# Patient Record
Sex: Male | Born: 1935 | Race: White | Hispanic: No | Smoking: Never smoker
Health system: Southern US, Community
[De-identification: ages and names within clinical notes are randomized; demographics above are authoritative.]

## PROBLEM LIST (undated history)

## (undated) DIAGNOSIS — Q211 Atrial septal defect: Secondary | ICD-10-CM

## (undated) DIAGNOSIS — R9430 Abnormal result of cardiovascular function study, unspecified: Secondary | ICD-10-CM

## (undated) DIAGNOSIS — I10 Essential (primary) hypertension: Secondary | ICD-10-CM

## (undated) DIAGNOSIS — Q2112 Patent foramen ovale: Secondary | ICD-10-CM

## (undated) DIAGNOSIS — I480 Paroxysmal atrial fibrillation: Secondary | ICD-10-CM

## (undated) DIAGNOSIS — I2510 Atherosclerotic heart disease of native coronary artery without angina pectoris: Secondary | ICD-10-CM

## (undated) DIAGNOSIS — Z951 Presence of aortocoronary bypass graft: Secondary | ICD-10-CM

## (undated) DIAGNOSIS — Z950 Presence of cardiac pacemaker: Secondary | ICD-10-CM

## (undated) DIAGNOSIS — I2699 Other pulmonary embolism without acute cor pulmonale: Secondary | ICD-10-CM

## (undated) DIAGNOSIS — I441 Atrioventricular block, second degree: Secondary | ICD-10-CM

## (undated) DIAGNOSIS — I4892 Unspecified atrial flutter: Secondary | ICD-10-CM

## (undated) DIAGNOSIS — I340 Nonrheumatic mitral (valve) insufficiency: Secondary | ICD-10-CM

## (undated) DIAGNOSIS — C61 Malignant neoplasm of prostate: Secondary | ICD-10-CM

## (undated) DIAGNOSIS — N400 Enlarged prostate without lower urinary tract symptoms: Secondary | ICD-10-CM

## (undated) DIAGNOSIS — E785 Hyperlipidemia, unspecified: Secondary | ICD-10-CM

## (undated) HISTORY — DX: Essential (primary) hypertension: I10

## (undated) HISTORY — PX: TOTAL KNEE ARTHROPLASTY: SHX125

## (undated) HISTORY — DX: Atrial septal defect: Q21.1

## (undated) HISTORY — PX: BACK SURGERY: SHX140

## (undated) HISTORY — PX: TOTAL HIP ARTHROPLASTY: SHX124

## (undated) HISTORY — DX: Patent foramen ovale: Q21.12

## (undated) HISTORY — DX: Atrioventricular block, second degree: I44.1

## (undated) HISTORY — PX: INGUINAL HERNIA REPAIR: SUR1180

## (undated) HISTORY — DX: Paroxysmal atrial fibrillation: I48.0

## (undated) HISTORY — DX: Presence of aortocoronary bypass graft: Z95.1

## (undated) HISTORY — DX: Abnormal result of cardiovascular function study, unspecified: R94.30

## (undated) HISTORY — DX: Atherosclerotic heart disease of native coronary artery without angina pectoris: I25.10

## (undated) HISTORY — DX: Nonrheumatic mitral (valve) insufficiency: I34.0

## (undated) HISTORY — PX: OTHER SURGICAL HISTORY: SHX169

## (undated) HISTORY — DX: Hyperlipidemia, unspecified: E78.5

## (undated) HISTORY — DX: Unspecified atrial flutter: I48.92

## (undated) HISTORY — DX: Presence of cardiac pacemaker: Z95.0

---

## 2001-02-28 ENCOUNTER — Ambulatory Visit (HOSPITAL_BASED_OUTPATIENT_CLINIC_OR_DEPARTMENT_OTHER): Admission: RE | Admit: 2001-02-28 | Discharge: 2001-03-01 | Payer: Self-pay | Admitting: General Surgery

## 2001-02-28 ENCOUNTER — Encounter: Admission: RE | Admit: 2001-02-28 | Discharge: 2001-02-28 | Payer: Self-pay | Admitting: General Surgery

## 2001-02-28 ENCOUNTER — Encounter: Payer: Self-pay | Admitting: General Surgery

## 2007-05-31 ENCOUNTER — Encounter: Payer: Self-pay | Admitting: Cardiology

## 2008-04-26 HISTORY — PX: ATRIAL ABLATION SURGERY: SHX560

## 2008-06-09 ENCOUNTER — Inpatient Hospital Stay (HOSPITAL_COMMUNITY): Admission: EM | Admit: 2008-06-09 | Discharge: 2008-06-23 | Payer: Self-pay | Admitting: Cardiology

## 2008-06-09 ENCOUNTER — Ambulatory Visit: Payer: Self-pay | Admitting: Internal Medicine

## 2008-06-10 ENCOUNTER — Encounter: Payer: Self-pay | Admitting: Cardiology

## 2008-06-10 ENCOUNTER — Encounter: Payer: Self-pay | Admitting: Thoracic Surgery (Cardiothoracic Vascular Surgery)

## 2008-06-10 ENCOUNTER — Ambulatory Visit: Payer: Self-pay | Admitting: Thoracic Surgery (Cardiothoracic Vascular Surgery)

## 2008-06-11 ENCOUNTER — Encounter (INDEPENDENT_AMBULATORY_CARE_PROVIDER_SITE_OTHER): Payer: Self-pay | Admitting: Internal Medicine

## 2008-06-11 ENCOUNTER — Ambulatory Visit: Payer: Self-pay | Admitting: Cardiology

## 2008-06-17 HISTORY — PX: CORONARY ARTERY BYPASS GRAFT: SHX141

## 2008-06-21 HISTORY — PX: CARDIAC PACEMAKER PLACEMENT: SHX583

## 2008-06-22 ENCOUNTER — Encounter: Payer: Self-pay | Admitting: Cardiology

## 2008-06-27 ENCOUNTER — Encounter: Payer: Self-pay | Admitting: Internal Medicine

## 2008-07-03 ENCOUNTER — Ambulatory Visit: Payer: Self-pay

## 2008-07-09 ENCOUNTER — Encounter: Admission: RE | Admit: 2008-07-09 | Discharge: 2008-07-09 | Payer: Self-pay | Admitting: Surgery

## 2008-07-09 ENCOUNTER — Ambulatory Visit: Payer: Self-pay | Admitting: Cardiology

## 2008-07-09 ENCOUNTER — Ambulatory Visit: Payer: Self-pay | Admitting: Surgery

## 2008-08-28 ENCOUNTER — Ambulatory Visit: Payer: Self-pay | Admitting: Cardiology

## 2008-08-28 ENCOUNTER — Encounter: Payer: Self-pay | Admitting: Physician Assistant

## 2008-09-10 ENCOUNTER — Encounter: Payer: Self-pay | Admitting: Cardiology

## 2008-10-18 ENCOUNTER — Encounter: Payer: Self-pay | Admitting: Physician Assistant

## 2008-10-18 ENCOUNTER — Ambulatory Visit: Payer: Self-pay | Admitting: Internal Medicine

## 2008-10-22 ENCOUNTER — Ambulatory Visit: Payer: Self-pay

## 2008-10-29 ENCOUNTER — Ambulatory Visit: Payer: Self-pay | Admitting: Cardiology

## 2008-11-12 ENCOUNTER — Encounter: Payer: Self-pay | Admitting: Internal Medicine

## 2008-11-12 ENCOUNTER — Ambulatory Visit: Payer: Self-pay

## 2008-11-18 ENCOUNTER — Encounter: Payer: Self-pay | Admitting: Cardiology

## 2008-11-19 ENCOUNTER — Ambulatory Visit: Payer: Self-pay | Admitting: Cardiology

## 2008-11-26 ENCOUNTER — Ambulatory Visit: Payer: Self-pay | Admitting: Cardiology

## 2008-12-03 ENCOUNTER — Ambulatory Visit: Payer: Self-pay | Admitting: Cardiology

## 2008-12-04 ENCOUNTER — Encounter: Payer: Self-pay | Admitting: Cardiology

## 2008-12-04 DIAGNOSIS — I2510 Atherosclerotic heart disease of native coronary artery without angina pectoris: Secondary | ICD-10-CM | POA: Insufficient documentation

## 2008-12-05 ENCOUNTER — Ambulatory Visit: Payer: Self-pay | Admitting: Cardiology

## 2008-12-05 ENCOUNTER — Encounter: Payer: Self-pay | Admitting: Physician Assistant

## 2008-12-06 ENCOUNTER — Ambulatory Visit: Payer: Self-pay | Admitting: Cardiology

## 2008-12-09 ENCOUNTER — Encounter: Payer: Self-pay | Admitting: *Deleted

## 2008-12-12 ENCOUNTER — Ambulatory Visit: Payer: Self-pay | Admitting: Cardiology

## 2008-12-12 ENCOUNTER — Encounter: Payer: Self-pay | Admitting: Physician Assistant

## 2008-12-13 ENCOUNTER — Encounter: Payer: Self-pay | Admitting: Cardiology

## 2008-12-20 ENCOUNTER — Ambulatory Visit: Payer: Self-pay | Admitting: Cardiology

## 2008-12-20 LAB — CONVERTED CEMR LAB: Prothrombin Time: 18.7 s

## 2008-12-25 ENCOUNTER — Ambulatory Visit: Payer: Self-pay | Admitting: Cardiology

## 2009-01-03 ENCOUNTER — Ambulatory Visit: Payer: Self-pay | Admitting: Cardiology

## 2009-01-10 ENCOUNTER — Ambulatory Visit: Payer: Self-pay | Admitting: Cardiology

## 2009-01-10 LAB — CONVERTED CEMR LAB: POC INR: 2.3

## 2009-01-13 ENCOUNTER — Telehealth (INDEPENDENT_AMBULATORY_CARE_PROVIDER_SITE_OTHER): Payer: Self-pay | Admitting: *Deleted

## 2009-01-13 ENCOUNTER — Encounter: Payer: Self-pay | Admitting: Internal Medicine

## 2009-01-17 ENCOUNTER — Ambulatory Visit: Payer: Self-pay | Admitting: Cardiology

## 2009-01-17 LAB — CONVERTED CEMR LAB: POC INR: 4.9

## 2009-01-24 ENCOUNTER — Ambulatory Visit: Payer: Self-pay | Admitting: Cardiology

## 2009-01-24 LAB — CONVERTED CEMR LAB: POC INR: 2.7

## 2009-01-28 ENCOUNTER — Telehealth (INDEPENDENT_AMBULATORY_CARE_PROVIDER_SITE_OTHER): Payer: Self-pay | Admitting: *Deleted

## 2009-01-28 ENCOUNTER — Ambulatory Visit: Payer: Self-pay | Admitting: Cardiology

## 2009-01-28 LAB — CONVERTED CEMR LAB: POC INR: 4.3

## 2009-02-03 ENCOUNTER — Encounter: Payer: Self-pay | Admitting: Internal Medicine

## 2009-02-03 ENCOUNTER — Ambulatory Visit: Payer: Self-pay | Admitting: Cardiology

## 2009-02-03 LAB — CONVERTED CEMR LAB: POC INR: 1.9

## 2009-02-04 ENCOUNTER — Ambulatory Visit: Payer: Self-pay | Admitting: Cardiology

## 2009-02-04 ENCOUNTER — Encounter: Payer: Self-pay | Admitting: Cardiology

## 2009-02-06 ENCOUNTER — Ambulatory Visit: Payer: Self-pay | Admitting: Internal Medicine

## 2009-02-06 ENCOUNTER — Inpatient Hospital Stay (HOSPITAL_COMMUNITY): Admission: RE | Admit: 2009-02-06 | Discharge: 2009-02-07 | Payer: Self-pay | Admitting: Internal Medicine

## 2009-02-06 ENCOUNTER — Encounter: Payer: Self-pay | Admitting: Internal Medicine

## 2009-02-11 ENCOUNTER — Ambulatory Visit: Payer: Self-pay | Admitting: Cardiology

## 2009-02-18 ENCOUNTER — Ambulatory Visit: Payer: Self-pay | Admitting: Cardiology

## 2009-02-18 LAB — CONVERTED CEMR LAB: POC INR: 3

## 2009-02-28 ENCOUNTER — Ambulatory Visit: Payer: Self-pay | Admitting: Cardiology

## 2009-03-07 ENCOUNTER — Ambulatory Visit: Payer: Self-pay | Admitting: Cardiology

## 2009-03-07 ENCOUNTER — Ambulatory Visit: Payer: Self-pay | Admitting: Internal Medicine

## 2009-03-28 ENCOUNTER — Ambulatory Visit: Payer: Self-pay | Admitting: Cardiology

## 2009-04-17 ENCOUNTER — Encounter: Payer: Self-pay | Admitting: Cardiology

## 2009-04-17 ENCOUNTER — Telehealth (INDEPENDENT_AMBULATORY_CARE_PROVIDER_SITE_OTHER): Payer: Self-pay | Admitting: *Deleted

## 2009-04-19 ENCOUNTER — Encounter: Payer: Self-pay | Admitting: Cardiology

## 2009-05-02 ENCOUNTER — Ambulatory Visit: Payer: Self-pay | Admitting: Cardiology

## 2009-05-02 ENCOUNTER — Telehealth: Payer: Self-pay | Admitting: Internal Medicine

## 2009-05-02 LAB — CONVERTED CEMR LAB: POC INR: 1

## 2009-05-16 ENCOUNTER — Ambulatory Visit: Payer: Self-pay | Admitting: Cardiology

## 2009-05-16 LAB — CONVERTED CEMR LAB: POC INR: 2

## 2009-06-13 ENCOUNTER — Ambulatory Visit: Payer: Self-pay | Admitting: Cardiology

## 2009-06-13 LAB — CONVERTED CEMR LAB: POC INR: 1.2

## 2009-06-27 ENCOUNTER — Ambulatory Visit: Payer: Self-pay | Admitting: Cardiology

## 2009-06-27 LAB — CONVERTED CEMR LAB: POC INR: 1.3

## 2009-07-11 ENCOUNTER — Ambulatory Visit: Payer: Self-pay | Admitting: Cardiology

## 2009-07-11 LAB — CONVERTED CEMR LAB: POC INR: 2.2

## 2009-07-14 ENCOUNTER — Ambulatory Visit (HOSPITAL_COMMUNITY): Admission: RE | Admit: 2009-07-14 | Discharge: 2009-07-14 | Payer: Self-pay | Admitting: Ophthalmology

## 2009-08-01 ENCOUNTER — Ambulatory Visit: Payer: Self-pay | Admitting: Cardiology

## 2009-09-05 ENCOUNTER — Ambulatory Visit: Payer: Self-pay | Admitting: Cardiology

## 2009-10-03 ENCOUNTER — Ambulatory Visit: Payer: Self-pay | Admitting: Cardiology

## 2009-10-03 ENCOUNTER — Ambulatory Visit: Payer: Self-pay | Admitting: Internal Medicine

## 2009-11-14 ENCOUNTER — Telehealth: Payer: Self-pay | Admitting: Internal Medicine

## 2009-11-14 ENCOUNTER — Ambulatory Visit: Payer: Self-pay | Admitting: Cardiology

## 2009-11-25 ENCOUNTER — Ambulatory Visit: Payer: Self-pay | Admitting: Cardiology

## 2009-12-09 ENCOUNTER — Ambulatory Visit: Payer: Self-pay | Admitting: Cardiology

## 2009-12-30 ENCOUNTER — Ambulatory Visit: Payer: Self-pay | Admitting: Cardiology

## 2009-12-30 LAB — CONVERTED CEMR LAB: POC INR: 2.5

## 2010-01-20 ENCOUNTER — Ambulatory Visit: Payer: Self-pay | Admitting: Cardiology

## 2010-01-20 LAB — CONVERTED CEMR LAB: POC INR: 2.1

## 2010-02-17 ENCOUNTER — Ambulatory Visit: Payer: Self-pay | Admitting: Cardiology

## 2010-03-03 ENCOUNTER — Ambulatory Visit: Payer: Self-pay | Admitting: Cardiology

## 2010-03-03 LAB — CONVERTED CEMR LAB: POC INR: 2.4

## 2010-03-31 ENCOUNTER — Ambulatory Visit: Payer: Self-pay | Admitting: Cardiology

## 2010-03-31 LAB — CONVERTED CEMR LAB: POC INR: 2.2

## 2010-04-28 ENCOUNTER — Ambulatory Visit: Admission: RE | Admit: 2010-04-28 | Discharge: 2010-04-28 | Payer: Self-pay | Source: Home / Self Care

## 2010-04-28 LAB — CONVERTED CEMR LAB: POC INR: 2.1

## 2010-05-04 ENCOUNTER — Ambulatory Visit
Admission: RE | Admit: 2010-05-04 | Discharge: 2010-05-04 | Payer: Self-pay | Source: Home / Self Care | Attending: Internal Medicine | Admitting: Internal Medicine

## 2010-05-04 ENCOUNTER — Encounter: Payer: Self-pay | Admitting: Internal Medicine

## 2010-05-04 DIAGNOSIS — I480 Paroxysmal atrial fibrillation: Secondary | ICD-10-CM | POA: Insufficient documentation

## 2010-05-26 ENCOUNTER — Ambulatory Visit: Admission: RE | Admit: 2010-05-26 | Discharge: 2010-05-26 | Payer: Self-pay | Source: Home / Self Care

## 2010-05-26 LAB — CONVERTED CEMR LAB: POC INR: 1.6

## 2010-05-26 NOTE — Medication Information (Signed)
Summary: ccr-lr  Anticoagulant Therapy  Managed by: Vashti Hey, RN PCP: Doreen Beam, MD Supervising MD: Diona Browner MD, Remi Deter Indication 1: Atrial Flutter (ICD-427.32) Lab [facility] [place] Site: [place] INR POC 1.0  Dietary changes: no    Health status changes: no    Bleeding/hemorrhagic complications: no    Recent/future hospitalizations: yes       Details: was in hospital at [facility] for severe dizzy spell   Any changes in medication regimen? no    Recent/future dental: no  Any missed doses?: yes     Details: Purposefully missed coumadin 2 days last week  Is patient compliant with meds? no     Details: Occassionally adjusts coumadin himself  Comments: Pt was admitted to [facility] 04/17/09 - 04/19/09 for severe dizzy spell at home.  Questionable vertigo vs CVA/TIA.  CT scan negative.  Unable to have MRI due to pacer.  Was treated with Meclizine for vertigo.  Pt is convinced blood was too thin and that is what caused dizziness.  INR on admission was 1.2.  Pt does not want to continue to take coumadin and purposefully held 2 doses last week.  INR today 1.0.   Stressed importance of taking coumadin as ordered to reduce risk of CVA.  Pt wants to come off coumadin but agrees to take until I discuss with Dr Johney Frame.  (Last office note states he might be able to stop after next pacer check in May)  Will forward this message to Dr Johney Frame.  Allergies: 1)  ! Crestor  Anticoagulation Management History:      The patient is taking warfarin and comes in today for a routine follow up visit.  Positive risk factors for bleeding include an age of 61 years or older.  The bleeding index is 'intermediate risk'.  Positive CHADS2 values include History of HTN and Age > 12 years old.  The start date was 10/18/2008.  Anticoagulation responsible provider: Diona Browner MD, Remi Deter.  INR POC: 1.0.  Cuvette Lot#: [id number].  Exp: 10/11.    Anticoagulation Management Assessment/Plan:      The patient's current  anticoagulation dose is Warfarin sodium 5 mg tabs: Use as directed by Anticoagulation Clinic.  The target INR is 2 - 3.  The next INR is due 05/16/2009.  Anticoagulation instructions were given to patient.  Results were reviewed/authorized by Vashti Hey, RN.  He was notified by Vashti Hey RN.         Prior Anticoagulation Instructions: INR 2.2 Continue coumadin 15mg  once daily except 10mg  on S,T,Th  Current Anticoagulation Instructions: INR 1.0 See previous note this visit Continue coumadin 15mg  once daily except 10mg  on S,T,Th

## 2010-05-26 NOTE — Medication Information (Signed)
Summary: ccr-lr  Anticoagulant Therapy  Managed by: Vashti Hey, RN PCP: Doreen Beam, MD Supervising MD: Antoine Poche MD, Fayrene Fearing Indication 1: Atrial Flutter (ICD-427.32) Lab [facility] [place] Site: [place] INR POC 2.2  Dietary changes: no    Health status changes: no    Bleeding/hemorrhagic complications: no    Recent/future hospitalizations: no    Any changes in medication regimen? no    Recent/future dental: no  Any missed doses?: yes     Details: Missed several doses of his coumadin due to bleeding from cracked lips  Is patient compliant with meds? no     Details: hold coumadin himself and take as he thinks he needs to   Allergies: 1)  ! Crestor  Anticoagulation Management History:      The patient is taking warfarin and comes in today for a routine follow up visit.  Positive risk factors for bleeding include an age of 28 years or older.  The bleeding index is 'intermediate risk'.  Positive CHADS2 values include History of HTN and Age > 75 years old.  The start date was 10/18/2008.  Anticoagulation responsible Ismeal Heider: Antoine Poche MD, Fayrene Fearing.  INR POC: 2.2.  Cuvette Lot#: [id number].  Exp: 10/11.    Anticoagulation Management Assessment/Plan:      The patient's current anticoagulation dose is Warfarin sodium 5 mg tabs: Use as directed by Anticoagulation Clinic.  The target INR is 2 - 3.  The next INR is due 04/28/2010.  Anticoagulation instructions were given to patient.  Results were reviewed/authorized by Vashti Hey, RN.  He was notified by Vashti Hey RN.         Prior Anticoagulation Instructions: INR 2.4 Continue coumadin 15mg  once daily    Current Anticoagulation Instructions: INR 2.2 Continue coumadin 15mg  once daily

## 2010-05-26 NOTE — Medication Information (Signed)
Summary: cccr-lr  Anticoagulant Therapy  Managed by: Vashti Hey, RN PCP: Doreen Beam, MD Supervising MD: Andee Lineman MD, Michelle Piper Indication 1: Atrial Flutter (ICD-427.32) Lab [facility]: [facility] [place] Site: [place] INR POC 1.3  Dietary changes: no    Health status changes: no    Bleeding/hemorrhagic complications: no    Recent/future hospitalizations: no    Any changes in medication regimen? no    Recent/future dental: no  Any missed doses?: yes     Details: missed 2 days  Rx ran out  Is patient compliant with meds? yes       Allergies: 1)  ! Crestor  Anticoagulation Management History:      The patient is taking warfarin and comes in today for a routine follow up visit.  Positive risk factors for bleeding include an age of 30 years or older.  The bleeding index is 'intermediate risk'.  Positive CHADS2 values include History of HTN and Age > 65 years old.  The start date was 10/18/2008.  Anticoagulation responsible provider: Andee Lineman MD, Michelle Piper.  INR POC: 1.3.  Cuvette Lot#: [id number].  Exp: 10/11.    Anticoagulation Management Assessment/Plan:      The patient's current anticoagulation dose is Warfarin sodium 5 mg tabs: Use as directed by Anticoagulation Clinic.  The target INR is 2 - 3.  The next INR is due 07/11/2009.  Anticoagulation instructions were given to patient.  Results were reviewed/authorized by Vashti Hey, RN.  He was notified by Vashti Hey RN.         Prior Anticoagulation Instructions: INR 1.2 Take coumadin 20mg  x 3 days then resume 15mg  once daily except 10mg  on S,T,Th.  Current Anticoagulation Instructions: INR 1.3 Take 20mg  x 3 days then resume 15mg  once daily except 10mg  on S,T,Th Pt still wants to come off coumadin  If INR not better next INR check, will discontinue coumadin per Dr Jenel Lucks previous note.

## 2010-05-26 NOTE — Medication Information (Signed)
Summary: ccr-lr  Anticoagulant Therapy  Managed by: Vashti Hey, RN PCP: Doreen Beam, MD Supervising MD: Diona Browner MD, Remi Deter Indication 1: Atrial Flutter (ICD-427.32) Lab [facility] [place] Site: [place] INR POC 1.1  Dietary changes: no    Health status changes: no    Bleeding/hemorrhagic complications: no    Recent/future hospitalizations: no    Any changes in medication regimen? no    Recent/future dental: no  Any missed doses?: yes     Details: Has been taking 1 1/2 tablets instead of 3  Is patient compliant with meds? no     Details: not taking coumadin correctly.  Thinks it makes him swimmy-headed   Allergies: 1)  ! Crestor  Anticoagulation Management History:      The patient is taking warfarin and comes in today for a routine follow up visit.  Positive risk factors for bleeding include an age of 75 years or older.  The bleeding index is 'intermediate risk'.  Positive CHADS2 values include History of HTN and Age > 68 years old.  The start date was 10/18/2008.  Anticoagulation responsible Izola Teague: Diona Browner MD, Remi Deter.  INR POC: 1.1.  Cuvette Lot#: [id number].  Exp: 10/11.    Anticoagulation Management Assessment/Plan:      The patient's current anticoagulation dose is Warfarin sodium 5 mg tabs: Use as directed by Anticoagulation Clinic.  The target INR is 2 - 3.  The next INR is due 03/03/2010.  Anticoagulation instructions were given to patient.  Results were reviewed/authorized by Vashti Hey, RN.  He was notified by Vashti Hey RN.         Prior Anticoagulation Instructions: INR 2.1 Continue coumadin 3 tablets once daily   Current Anticoagulation Instructions: INR 1.1 Has not been taking coumadin correctly.  Thinks it makes him swimmy headed. Risk of CVA stressed to pt. Restart coumadin 15mg  once daily

## 2010-05-26 NOTE — Medication Information (Signed)
Summary: ccr-lr  Anticoagulant Therapy  Managed by: Vashti Hey, RN PCP: Doreen Beam, MD Supervising MD: Diona Browner MD, Remi Deter Indication 1: Atrial Flutter (ICD-427.32) Lab [facility] [place] Site: [place] INR POC 2.3  Dietary changes: no    Health status changes: no    Bleeding/hemorrhagic complications: no    Recent/future hospitalizations: yes       Details: had cataract surgery last week  Any changes in medication regimen? no    Recent/future dental: no  Any missed doses?: no       Is patient compliant with meds? yes       Allergies: 1)  ! Crestor  Anticoagulation Management History:      The patient is taking warfarin and comes in today for a routine follow up visit.  Positive risk factors for bleeding include an age of 46 years or older.  The bleeding index is 'intermediate risk'.  Positive CHADS2 values include History of HTN and Age > 66 years old.  The start date was 10/18/2008.  Anticoagulation responsible provider: Diona Browner MD, Remi Deter.  INR POC: 2.3.  Cuvette Lot#: [id number].  Exp: 10/11.    Anticoagulation Management Assessment/Plan:      The patient's current anticoagulation dose is Warfarin sodium 5 mg tabs: Use as directed by Anticoagulation Clinic.  The target INR is 2 - 3.  The next INR is due 08/29/2009.  Anticoagulation instructions were given to patient.  Results were reviewed/authorized by Vashti Hey, RN.  He was notified by Vashti Hey RN.         Prior Anticoagulation Instructions: INR 2.2 Continue coumadin 15mg  once daily except 10mg  on S,T,Th Pt states he has been taking coumadin regularly as ordered this month.  Is bad for non-compliance in taking coumadin.  Current Anticoagulation Instructions: INR 2.3 Continue coumadin 15mg  once daily except 10mg  on S,T,Th

## 2010-05-26 NOTE — Medication Information (Signed)
Summary: ccr-lr  Anticoagulant Therapy  Managed by: Vashti Hey, RN PCP: Doreen Beam, MD Supervising MD: Myrtis Ser MD, Tinnie Gens Indication 1: Atrial Flutter (ICD-427.32) Lab [facility] [place] Site: [place] INR POC 1.8  Dietary changes: no    Health status changes: no    Bleeding/hemorrhagic complications: no    Recent/future hospitalizations: no    Any changes in medication regimen? no    Recent/future dental: no  Any missed doses?: no       Is patient compliant with meds? yes       Allergies: 1)  ! Crestor  Anticoagulation Management History:      The patient is taking warfarin and comes in today for a routine follow up visit.  Positive risk factors for bleeding include an age of 75 years or older.  The bleeding index is 'intermediate risk'.  Positive CHADS2 values include History of HTN and Age > 41 years old.  The start date was 10/18/2008.  Anticoagulation responsible provider: Myrtis Ser MD, Tinnie Gens.  INR POC: 1.8.  Cuvette Lot#: [id number].  Exp: 10/11.    Anticoagulation Management Assessment/Plan:      The patient's current anticoagulation dose is Warfarin sodium 5 mg tabs: Use as directed by Anticoagulation Clinic.  The target INR is 2 - 3.  The next INR is due 10/03/2009.  Anticoagulation instructions were given to patient.  Results were reviewed/authorized by Vashti Hey, RN.  He was notified by Vashti Hey RN.         Prior Anticoagulation Instructions: INR 2.3 Continue coumadin 15mg  once daily except 10mg  on S,T,Th  Current Anticoagulation Instructions: INR 1.8 Take coumadin 20mg  tonight and tomorrow night then resume 15mg  once daily except 10mg  on S,T,Th

## 2010-05-26 NOTE — Medication Information (Signed)
Summary: ccr-lr  Anticoagulant Therapy  Managed by: Vashti Hey, RN PCP: Doreen Beam, MD Supervising MD: Diona Browner MD, Remi Deter Indication 1: Atrial Flutter (ICD-427.32) Lab [facility] [place] Site: [place] INR POC 2.4  Dietary changes: no    Health status changes: no    Bleeding/hemorrhagic complications: no    Recent/future hospitalizations: no    Any changes in medication regimen? no    Recent/future dental: no  Any missed doses?: no       Is patient compliant with meds? yes       Allergies: 1)  ! Crestor  Anticoagulation Management History:      The patient is taking warfarin and comes in today for a routine follow up visit.  Positive risk factors for bleeding include an age of 75 years or older.  The bleeding index is 'intermediate risk'.  Positive CHADS2 values include History of HTN and Age > 53 years old.  The start date was 10/18/2008.  Anticoagulation responsible Salem Mastrogiovanni: Diona Browner MD, Remi Deter.  INR POC: 2.4.  Cuvette Lot#: [id number].  Exp: 10/11.    Anticoagulation Management Assessment/Plan:      The patient's current anticoagulation dose is Warfarin sodium 5 mg tabs: Use as directed by Anticoagulation Clinic.  The target INR is 2 - 3.  The next INR is due 03/31/2010.  Anticoagulation instructions were given to patient.  Results were reviewed/authorized by Vashti Hey, RN.  He was notified by Vashti Hey RN.         Prior Anticoagulation Instructions: INR 1.1 Has not been taking coumadin correctly.  Thinks it makes him swimmy headed. Risk of CVA stressed to pt. Restart coumadin 15mg  once daily   Current Anticoagulation Instructions: INR 2.4 Continue coumadin 15mg  once daily

## 2010-05-26 NOTE — Medication Information (Signed)
Summary: ccr-lr  Anticoagulant Therapy  Managed by: Vashti Hey, RN PCP: Doreen Beam, MD Supervising MD: Antoine Poche MD, Fayrene Fearing Indication 1: Atrial Flutter (ICD-427.32) Lab [facility]: [facility] [place] Site: [place] INR POC 2.5  Dietary changes: no    Health status changes: no    Bleeding/hemorrhagic complications: no    Recent/future hospitalizations: no    Any changes in medication regimen? no    Recent/future dental: no  Any missed doses?: no       Is patient compliant with meds? yes       Allergies: 1)  ! Crestor  Anticoagulation Management History:      The patient is taking warfarin and comes in today for a routine follow up visit.  Positive risk factors for bleeding include an age of 75 years or older.  The bleeding index is 'intermediate risk'.  Positive CHADS2 values include History of HTN and Age > 25 years old.  The start date was 10/18/2008.  Anticoagulation responsible provider: Antoine Poche MD, Fayrene Fearing.  INR POC: 2.5.  Cuvette Lot#: [id number].  Exp: 10/11.    Anticoagulation Management Assessment/Plan:      The patient's current anticoagulation dose is Warfarin sodium 5 mg tabs: Use as directed by Anticoagulation Clinic.  The target INR is 2 - 3.  The next INR is due 01/20/2010.  Anticoagulation instructions were given to patient.  Results were reviewed/authorized by Vashti Hey, RN.  He was notified by Vashti Hey RN.         Prior Anticoagulation Instructions: INR 1.7 Increase coumadin to 15mg  once daily   Current Anticoagulation Instructions: INR 2.5 Continue coumadin 15mg  once daily

## 2010-05-26 NOTE — Medication Information (Signed)
Summary: ccr-lr  Anticoagulant Therapy  Managed by: Vashti Hey, RN PCP: Doreen Beam, MD Supervising MD: Diona Browner MD, Remi Deter Indication 1: Atrial Flutter (ICD-427.32) Lab [facility] [place] Site: [place] INR POC 2.3  Dietary changes: no    Health status changes: no    Bleeding/hemorrhagic complications: no    Recent/future hospitalizations: no    Any changes in medication regimen? no    Recent/future dental: no  Any missed doses?: yes     Details: Missed 3 days   Left med in [place] extra when he got it .  Advised against this.  Is patient compliant with meds? yes       Allergies: 1)  ! Crestor  Anticoagulation Management History:      The patient is taking warfarin and comes in today for a routine follow up visit.  Positive risk factors for bleeding include an age of 5 years or older.  The bleeding index is 'intermediate risk'.  Positive CHADS2 values include History of HTN and Age > 82 years old.  The start date was 10/18/2008.  Anticoagulation responsible provider: Diona Browner MD, Remi Deter.  INR POC: 2.3.  Cuvette Lot#: [id number].  Exp: 10/11.    Anticoagulation Management Assessment/Plan:      The patient's current anticoagulation dose is Warfarin sodium 5 mg tabs: Use as directed by Anticoagulation Clinic.  The target INR is 2 - 3.  The next INR is due 10/31/2009.  Anticoagulation instructions were given to patient.  Results were reviewed/authorized by Vashti Hey, RN.  He was notified by Vashti Hey RN.         Prior Anticoagulation Instructions: INR 1.8 Take coumadin 20mg  tonight and tomorrow night then resume 15mg  once daily except 10mg  on S,T,Th  Current Anticoagulation Instructions: INR 2.3 Continue coumadin 15mg  once daily except 10mg  on S,T,Th

## 2010-05-26 NOTE — Progress Notes (Signed)
Summary: pt wants to stop coumadin  Phone Note Outgoing Call   Call placed by: Vashti Hey RN CV[facility] [place] Call placed to: Dr Johney Frame Summary of Call: Pt wants to stop coumadin against my recommendation.  Please review below and advise.   Pt was admitted to [facility] 04/17/09 - 04/19/09 for severe dizzy spell at home.  Questionable vertigo vs CVA/TIA.  CT scan negative.  Unable to have MRI due to pacer.  Was treated with Meclizine for vertigo.  Pt is convinced blood was too thin and that is what caused dizziness.  INR on admission was 1.2.  Pt does not want to continue to take coumadin and purposefully held 2 doses last week. (Pt is adjusting his coumadin according to how he feels).  INR today 1.0.   Stressed importance of taking coumadin as ordered to reduce risk of CVA.  Pt wants to come off coumadin but agrees to take until I discuss with Dr Johney Frame.  (Last office note states he might be able to stop after next pacer check in May)  Will forward this message to Dr Johney Frame.  Follow-up for Phone Call        The patient is s/p flutter ablation.  He has not had clearly documented afib.  If he is insistent on stopping coumadin, then let him.  If he develops significant afib by pacemaker interrogation in the future, then we will need to readdress coumadin vs pradaxa at that time. I would be happy to discuss this with him further in the office if he is interested. Follow-up by: Hillis Range, MD,  May 14, 2009 6:34 PM  Additional Follow-up for Phone Call Additional follow up Details #1::        Saw pt for iNR check today.  INR 2.0  P)t has not had any more dizzy episodes like the one that put him in the hospital.  He has agreed to conitnue taking coumadin at this time, hopefully until he is due for pacer interrogation. Additional Follow-up by: Vashti Hey RN,  May 16, 2009 2:38 PM

## 2010-05-26 NOTE — Medication Information (Signed)
Summary: ccr-lr  Anticoagulant Therapy  Managed by: Vashti Hey, RN PCP: Doreen Beam, MD Supervising MD: Diona Browner MD, Remi Deter Indication 1: Atrial Flutter (ICD-427.32) Lab [facility] [place] Site: [place] INR POC 1.2  Dietary changes: no    Health status changes: no    Bleeding/hemorrhagic complications: no    Recent/future hospitalizations: no    Any changes in medication regimen? no    Recent/future dental: no  Any missed doses?: no       Is patient compliant with meds? yes      Comments: Pt denies missing doses but INR 1.2  Allergies: 1)  ! Crestor  Anticoagulation Management History:      The patient is taking warfarin and comes in today for a routine follow up visit.  Positive risk factors for bleeding include an age of 75 years or older.  The bleeding index is 'intermediate risk'.  Positive CHADS2 values include History of HTN and Age > 56 years old.  The start date was 10/18/2008.  Anticoagulation responsible provider: Diona Browner MD, Remi Deter.  INR POC: 1.2.  Cuvette Lot#: [id number].  Exp: 10/11.    Anticoagulation Management Assessment/Plan:      The patient's current anticoagulation dose is Warfarin sodium 5 mg tabs: Use as directed by Anticoagulation Clinic.  The target INR is 2 - 3.  The next INR is due 06/27/2009.  Anticoagulation instructions were given to patient.  Results were reviewed/authorized by Vashti Hey, RN.  He was notified by Vashti Hey RN.         Prior Anticoagulation Instructions: INR 2.0 Continue coumadin 15mg  once daily except 10mg  on S,T,Th  Current Anticoagulation Instructions: INR 1.2 Take coumadin 20mg  x 3 days then resume 15mg  once daily except 10mg  on S,T,Th.

## 2010-05-26 NOTE — Medication Information (Signed)
Summary: ccr-lr  Anticoagulant Therapy  Managed by: Vashti Hey, RN PCP: Doreen Beam, MD Supervising MD: Diona Browner MD, Remi Deter Indication 1: Atrial Flutter (ICD-427.32) Lab [facility] [place] Site: [place] INR POC 1.2  Dietary changes: no    Health status changes: no    Bleeding/hemorrhagic complications: no    Recent/future hospitalizations: no    Any changes in medication regimen? no    Recent/future dental: no  Any missed doses?: yes     Details: missed 2 doses last week due to N/V  Is patient compliant with meds? yes       Allergies: 1)  ! Crestor  Anticoagulation Management History:      The patient is taking warfarin and comes in today for a routine follow up visit.  Positive risk factors for bleeding include an age of 75 years or older.  The bleeding index is 'intermediate risk'.  Positive CHADS2 values include History of HTN and Age > 16 years old.  The start date was 10/18/2008.  Anticoagulation responsible provider: Diona Browner MD, Remi Deter.  INR POC: 1.2.  Cuvette Lot#: [id number].  Exp: 10/11.    Anticoagulation Management Assessment/Plan:      The patient's current anticoagulation dose is Warfarin sodium 5 mg tabs: Use as directed by Anticoagulation Clinic.  The target INR is 2 - 3.  The next INR is due 11/25/2009.  Anticoagulation instructions were given to patient.  Results were reviewed/authorized by Vashti Hey, RN.  He was notified by Vashti Hey RN.         Prior Anticoagulation Instructions: INR 2.3 Continue coumadin 15mg  once daily except 10mg  on S,T,Th  Current Anticoagulation Instructions: INR 1.2 Take coumadin 20mg  tonight and tomorrow night then resume 15mg  once daily except 10mg  on S,T,Th Pt wants to stop coumadin.  Will discuss with Dr Johney Frame.

## 2010-05-26 NOTE — Progress Notes (Signed)
Summary: Wants to stop coumadin  Phone Note Outgoing Call   Call placed by: Vashti Hey RN Call placed to: Dr Johney Frame Reason for Call: Discuss lab or test results Summary of Call: Pt wants to come off coumadin again.  INR today 1.2  See note 05/02/09.  Is it still OK to discontinue or not?  Follow-up for Phone Call        His CHADS2 score is 3 (age, HTN, and prior EF 40%).  His pacemaker documents afib, though episodes are all short.  I would prefer that he be anticoagulated with either coumadin or pradaxa longterm.  I think that pradaxa would be a good alternative to coumadin, particularly given his recent INR value (1.2).  Would he be willing to try pradaxa? Follow-up by: Hillis Range, MD,  November 14, 2009 10:14 PM  Additional Follow-up for Phone Call Additional follow up Details #1::        Will discuss with pt at next INR check.  He is too HOH to discuss over the phone. Additional Follow-up by: Vashti Hey RN,  November 18, 2009 1:47 PM

## 2010-05-26 NOTE — Medication Information (Signed)
Summary: ccr-lr  Anticoagulant Therapy  Managed by: Vashti Hey, RN PCP: Doreen Beam, MD Supervising MD: Antoine Poche MD, Fayrene Fearing Indication 1: Atrial Flutter (ICD-427.32) [facility]  Site: [place] INR POC 1.7  Dietary changes: no    Health status changes: no    Bleeding/hemorrhagic complications: no    Recent/future hospitalizations: no    Any changes in medication regimen? no    Recent/future dental: no  Any missed doses?: no       Is patient compliant with meds? yes       Allergies: 1)  ! Crestor  Anticoagulation Management History:      The patient is taking warfarin and comes in today for a routine follow up visit.  Positive risk factors for bleeding include an age of 75 years or older.  The bleeding index is 'intermediate risk'.  Positive CHADS2 values include History of HTN and Age > 29 years old.  The start date was 10/18/2008.  Anticoagulation responsible provider: Antoine Poche MD, Fayrene Fearing.  INR POC: 1.7.  Cuvette Lot#: [id number].  Exp: 10/11.    Anticoagulation Management Assessment/Plan:      The patient's current anticoagulation dose is Warfarin sodium 5 mg tabs: Use as directed by Anticoagulation Clinic.  The target INR is 2 - 3.  The next INR is due 12/30/2009.  Anticoagulation instructions were given to patient.  Results were reviewed/authorized by Vashti Hey, RN.  He was notified by Vashti Hey RN.         Prior Anticoagulation Instructions: 1.4 Talked with pt about comadin vs pradaxa.  He states he will continue on coumadin.  Stressed importance of compliance. Increase coumadin to 15mg  once daily except 10mg  on Sundays  Current Anticoagulation Instructions: INR 1.7 Increase coumadin to 15mg  once daily

## 2010-05-26 NOTE — Medication Information (Signed)
Summary: ccr-lr  Anticoagulant Therapy  Managed by: Vashti Hey, RN PCP: Doreen Beam, MD Supervising MD: Diona Browner MD, Remi Deter Indication 1: Atrial Flutter (ICD-427.32) Lab [facility] [place] Site: [place] INR POC 2.0  Dietary changes: no    Health status changes: no    Bleeding/hemorrhagic complications: no    Recent/future hospitalizations: no    Any changes in medication regimen? no    Recent/future dental: no  Any missed doses?: no       Is patient compliant with meds? no     Details: adjusts coumadin himself occasionally according to how he feels  Comments: Pt has not had any dizzy episodes since last INR check so he has agreed to continue coumadin as ordered for now.  Pt will plan to continue coumadin unti he seesw Dr Johney Frame back and has pacer check in May.  Allergies: 1)  ! Crestor  Anticoagulation Management History:      The patient is taking warfarin and comes in today for a routine follow up visit.  Positive risk factors for bleeding include an age of 45 years or older.  The bleeding index is 'intermediate risk'.  Positive CHADS2 values include History of HTN and Age > 24 years old.  The start date was 10/18/2008.  Anticoagulation responsible provider: Diona Browner MD, Remi Deter.  INR POC: 2.0.  Cuvette Lot#: [id number].  Exp: 10/11.    Anticoagulation Management Assessment/Plan:      The patient's current anticoagulation dose is Warfarin sodium 5 mg tabs: Use as directed by Anticoagulation Clinic.  The target INR is 2 - 3.  The next INR is due 06/13/2009.  Anticoagulation instructions were given to patient.  Results were reviewed/authorized by Vashti Hey, RN.  He was notified by Vashti Hey RN.         Prior Anticoagulation Instructions: INR 1.0 See previous note this visit Continue coumadin 15mg  once daily except 10mg  on S,T,Th  Current Anticoagulation Instructions: INR 2.0 Continue coumadin 15mg  once daily except 10mg  on S,T,Th

## 2010-05-26 NOTE — Medication Information (Signed)
Summary: ccr-lr  Anticoagulant Therapy  Managed by: Vashti Hey, RN PCP: Doreen Beam, MD Supervising MD: Diona Browner MD, Remi Deter Indication 1: Atrial Flutter (ICD-427.32) [facility] [place] Site: [place] INR POC 1.4  Dietary changes: no    Health status changes: no    Bleeding/hemorrhagic complications: no    Recent/future hospitalizations: no    Any changes in medication regimen? no    Recent/future dental: no  Any missed doses?: no       Is patient compliant with meds? yes       Allergies: 1)  ! Crestor  Anticoagulation Management History:      The patient is taking warfarin and comes in today for a routine follow up visit.  Positive risk factors for bleeding include an age of 75 years or older.  The bleeding index is 'intermediate risk'.  Positive CHADS2 values include History of HTN and Age > 75 years old.  The start date was 10/18/2008.  Anticoagulation responsible provider: Diona Browner MD, Remi Deter.  INR POC: 1.4.  Cuvette Lot#: [id number].  Exp: 10/11.    Anticoagulation Management Assessment/Plan:      The patient's current anticoagulation dose is Warfarin sodium 5 mg tabs: Use as directed by Anticoagulation Clinic.  The target INR is 2 - 3.  The next INR is due 12/09/2009.  Anticoagulation instructions were given to patient.  Results were reviewed/authorized by Vashti Hey, RN.  He was notified by Vashti Hey RN.         Prior Anticoagulation Instructions: INR 1.2 Take coumadin 20mg  tonight and tomorrow night then resume 15mg  once daily except 10mg  on S,T,Th Pt wants to stop coumadin.  Will discuss with Dr Johney Frame.  Current Anticoagulation Instructions: 1.4 Talked with pt about comadin vs pradaxa.  He states he will continue on coumadin.  Stressed importance of compliance. Increase coumadin to 15mg  once daily except 10mg  on Sundays

## 2010-05-26 NOTE — Medication Information (Signed)
Summary: ccr-lr  Anticoagulant Therapy  Managed by: Vashti Hey, RN PCP: Doreen Beam, MD Supervising MD: Andee Lineman MD, Michelle Piper Indication 1: Atrial Flutter (ICD-427.32) Lab [facility] [place] Site: [place] INR POC 2.1  Dietary changes: no    Health status changes: no    Bleeding/hemorrhagic complications: no    Recent/future hospitalizations: no    Any changes in medication regimen? no    Recent/future dental: no  Any missed doses?: no       Is patient compliant with meds? yes       Allergies: 1)  ! Crestor  Anticoagulation Management History:      The patient is taking warfarin and comes in today for a routine follow up visit.  Positive risk factors for bleeding include an age of 19 years or older.  The bleeding index is 'intermediate risk'.  Positive CHADS2 values include History of HTN and Age > 53 years old.  The start date was 10/18/2008.  Anticoagulation responsible Jaran Sainz: Andee Lineman MD, Michelle Piper.  INR POC: 2.1.  Cuvette Lot#: [id number].  Exp: 10/11.    Anticoagulation Management Assessment/Plan:      The patient's current anticoagulation dose is Warfarin sodium 5 mg tabs: Use as directed by Anticoagulation Clinic.  The target INR is 2 - 3.  The next INR is due 02/17/2010.  Anticoagulation instructions were given to patient.  Results were reviewed/authorized by Vashti Hey, RN.  He was notified by Vashti Hey RN.         Prior Anticoagulation Instructions: INR 2.5 Continue coumadin 15mg  once daily   Current Anticoagulation Instructions: INR 2.1 Continue coumadin 3 tablets once daily

## 2010-05-26 NOTE — Medication Information (Signed)
Summary: CCR  Anticoagulant Therapy  Managed by: Vashti Hey, RN PCP: Doreen Beam, MD Supervising MD: Myrtis Ser MD, Tinnie Gens Indication 1: Atrial Flutter (ICD-427.32) Lab [facility] [place] Site: [place] INR POC 2.2  Dietary changes: no    Health status changes: no    Bleeding/hemorrhagic complications: no    Recent/future hospitalizations: no    Any changes in medication regimen? no    Recent/future dental: no  Any missed doses?: no       Is patient compliant with meds? yes       Allergies: 1)  ! Crestor  Anticoagulation Management History:      The patient is taking warfarin and comes in today for a routine follow up visit.  Positive risk factors for bleeding include an age of 75 years or older.  The bleeding index is 'intermediate risk'.  Positive CHADS2 values include History of HTN and Age > 30 years old.  The start date was 10/18/2008.  Anticoagulation responsible provider: Myrtis Ser MD, Tinnie Gens.  INR POC: 2.2.  Cuvette Lot#: [id number].  Exp: 10/11.    Anticoagulation Management Assessment/Plan:      The patient's current anticoagulation dose is Warfarin sodium 5 mg tabs: Use as directed by Anticoagulation Clinic.  The target INR is 2 - 3.  The next INR is due 08/01/2009.  Anticoagulation instructions were given to patient.  Results were reviewed/authorized by Vashti Hey, RN.  He was notified by Vashti Hey RN.         Prior Anticoagulation Instructions: INR 1.3 Take 20mg  x 3 days then resume 15mg  once daily except 10mg  on S,T,Th Pt still wants to come off coumadin  If INR not better next INR check, will discontinue coumadin per Dr Jenel Lucks previous note.  Current Anticoagulation Instructions: INR 2.2 Continue coumadin 15mg  once daily except 10mg  on S,T,Th Pt states he has been taking coumadin regularly as ordered this month.  Is bad for non-compliance in taking coumadin.

## 2010-05-26 NOTE — Assessment & Plan Note (Signed)
Summary: [employer] Jude  -RECV REMINDER   Current Medications (verified): 1)  Metoprolol Tartrate 25 Mg Tabs (Metoprolol Tartrate) .... Take One Tablet By Mouth Twice A Day 2)  Warfarin Sodium 5 Mg Tabs (Warfarin Sodium) .... Use As Directed By Anticoagulation Clinic 3)  Aspirin 81 Mg Tbec (Aspirin) .... Take One Tablet By Mouth Daily 4)  Vitamin C 500 Mg  Tabs (Ascorbic Acid) .... Once Daily 5)  Docusate Sodium 100 Mg Caps (Docusate Sodium) .... Two Times A Day  Allergies (verified): 1)  ! Crestor   PPM Specifications Following MD:  Sherryl Manges, MD     PPM Vendor:  St Jude     PPM Model Number:  [id number]     PPM Serial Number:  [id number] PPM DOI:  06/21/2008     PPM Implanting MD:  Hillis Range, MD  Lead 1    Location: [facility]     DOI: 06/21/2008     Model #: 1688TC     Serial #: [id number]     Status: active Lead 2    Location: RV     DOI: 06/21/2008     Model #: 1688TC     Serial #: [id number]     Status: active  Magnet Response Rate:  BOL 98.6 ERI  86.3  Indications:  A-flutter   PPM Follow Up Remote Check?  No Battery Voltage:  2.81 V     Battery Est. Longevity:  7.5 years     Pacer Dependent:  Yes       PPM Device Measurements Atrium  Amplitude: 2.5 mV, Impedance: 389 ohms, Threshold: 0.75 V at 0.4 msec Right Ventricle  Amplitude: 12 mV, Impedance: 489 ohms, Threshold: 0.875 V at 0.4 msec  Episodes MS Episodes:  70     Percent Mode Switch:  <1%     Coumadin:  Yes  Parameters Mode:  DDD     Lower Rate Limit:  70     Upper Rate Limit:  110 Paced AV Delay:  200     Sensed AV Delay:  170 Next Cardiology Appt Due:  03/26/2010 Tech Comments:  Post flutter ablation 10/10.  Sinus brady today @ 35.  Mr. Winningham remains on coumadin.  There were 70 mode switch episodes < 1 % the longest 5:02 minutes.  No parameter changes.  ROV 6 months with Dr. Johney Frame in [employer]. Altha Harm, LPN  October 03, 2009 11:23 AM

## 2010-05-28 NOTE — Cardiovascular Report (Signed)
Summary: [facility]/ [facility]  [facility]/ [facility] SUMMARY   Imported By: Dorise Hiss 05/06/2010 16:02:27  _____________________________________________________________________  External Attachment:    Type:   Image     Comment:   External Document

## 2010-05-28 NOTE — Medication Information (Signed)
Summary: ccr-lr  Anticoagulant Therapy  Managed by: Vashti Hey, RN PCP: Doreen Beam, MD Supervising MD: Diona Browner MD, Remi Deter Indication 1: Atrial Flutter (ICD-427.32) Lab [facility] [place] Site: [place] INR POC 2.1  Dietary changes: no    Health status changes: no    Bleeding/hemorrhagic complications: no    Recent/future hospitalizations: no    Any changes in medication regimen? no    Recent/future dental: no  Any missed doses?: no       Is patient compliant with meds? yes       Allergies: 1)  ! Crestor  Anticoagulation Management History:      The patient is taking warfarin and comes in today for a routine follow up visit.  Positive risk factors for bleeding include an age of 75 years or older.  The bleeding index is 'intermediate risk'.  Positive CHADS2 values include History of HTN and Age > 75 years old.  The start date was 10/18/2008.  Anticoagulation responsible provider: Diona Browner MD, Remi Deter.  INR POC: 2.1.  Cuvette Lot#: [id number].  Exp: 10/11.    Anticoagulation Management Assessment/Plan:      The patient's current anticoagulation dose is Warfarin sodium 5 mg tabs: Use as directed by Anticoagulation Clinic.  The target INR is 2 - 3.  The next INR is due 05/26/2010.  Anticoagulation instructions were given to patient.  Results were reviewed/authorized by Vashti Hey, RN.  He was notified by Vashti Hey RN.         Prior Anticoagulation Instructions: INR 2.2 Continue coumadin 15mg  once daily   Current Anticoagulation Instructions: INR 2.1 Continue coumadin 15mg  once daily

## 2010-05-28 NOTE — Assessment & Plan Note (Signed)
Summary: 6 mo fu per dec reminder   Visit Type:  Pacemaker check Primary Provider:  Doreen Beam, MD   History of Present Illness: The patient reports doing very well since last being seen in our clinic.  The patient denies symptoms of palpitations, chest pain, shortness of breath, orthopnea, PND, lower extremity edema, dizziness, presyncope, syncope, or neurologic sequela. The patient is tolerating medications without difficulties and is otherwise without complaint today.   Preventive Screening-Counseling & Management  Alcohol-Tobacco     Smoking Status: never  Current Medications (verified): 1)  Metoprolol Tartrate 25 Mg Tabs (Metoprolol Tartrate) .... Take One Tablet By Mouth Twice A Day 2)  Warfarin Sodium 5 Mg Tabs (Warfarin Sodium) .... Use As Directed By Anticoagulation Clinic 3)  Aspirin 81 Mg Tbec (Aspirin) .... Take One Tablet By Mouth Daily 4)  Docusate Sodium 100 Mg Caps (Docusate Sodium) .... Two Times A Day  Allergies (verified): 1)  ! Crestor  Comments:  Nurse/Medical Assistant: The patient's medications and allergies were reviewed with the patient and were updated in the Medication and Allergy Lists. Reviewed list w/ pt. Tammi Romine CMA (May 04, 2010 1:25 PM)  Past History:  Past Medical History: Reviewed history from 12/05/2008 and no changes required. MITRAL REGURGITATION, 0 (MILD) (ICD-396.3) HYPERLIPIDEMIA-MIXED (ICD-272.4) HYPERTENSION, UNSPECIFIED (ICD-401.9) ATRIOVENTRICULAR BLOCK, MOBITZ TYPE II (ICD-426.12) St. Jude dual-chamber permanent pacemaker. ATRIAL FLUTTER (ICD-427.32) CAD, NATIVE VESSEL (ICD-414.01)  non-ST elevation myocardial infarction. 5 vessel coronary artery bypass grafting.  Past Surgical History: Reviewed history from 12/04/2008 and no changes required. Right inguinal hernia repair.  Repair of trauma left lower extremity in the distant past secondary to a fall.  Back Surgery Hip Arthroplasty-Total Knee  Arthroplasty-Total CABG PACEMAKER  Social History: Reviewed history from 12/04/2008 and no changes required. Disabled  Divorced  Tobacco Use - No.  Alcohol Use - no Retired  Regular Exercise - yes Drug Use - no  Review of Systems       All systems are reviewed and negative except as listed in the HPI.   Vital Signs:  Patient profile:   77 year old male Height:      70 inches Weight:      188 pounds BMI:     27.07 Pulse rate:   76 / minute BP sitting:   149 / 85  (left arm) Cuff size:   regular  Vitals Entered By: Fuller Plan CMA (May 04, 2010 1:26 PM)  Physical Exam  General:  Well developed, well nourished, in no acute distress. Head:  normocephalic and atraumatic Eyes:  PERRLA/EOM intact; conjunctiva and lids normal. Mouth:  Teeth, gums and palate normal. Oral mucosa normal. Neck:  Neck supple, no JVD. No masses, thyromegaly or abnormal cervical nodes. Lungs:  Clear bilaterally to auscultation and percussion. Heart:  Non-displaced PMI, chest non-tender; regular rate and rhythm, S1, S2 without murmurs, rubs or gallops. Carotid upstroke normal, no bruit. Normal abdominal aortic size, no bruits. Femorals normal pulses, no bruits. Pedals normal pulses. No edema, no varicosities. Abdomen:  Bowel sounds positive; abdomen soft and non-tender without masses, organomegaly, or hernias noted. No hepatosplenomegaly. Msk:  Back normal, normal gait. Muscle strength and tone normal. Extremities:  No clubbing or cyanosis. Neurologic:  Alert and oriented x 3.   PPM Specifications Following MD:  Sherryl Manges, MD     PPM Vendor:  St Jude     PPM Model Number:  [id number]     PPM Serial Number:  [id number] PPM DOI:  06/21/2008     PPM Implanting MD:  Hillis Range, MD  Lead 1    Location: [facility]     DOI: 06/21/2008     Model #: 1688TC     Serial #: [id number]     Status: active Lead 2    Location: [facility]     DOI: 06/21/2008     Model #: 1688TC     Serial #: [id number]     Status: active  Magnet  Response Rate:  BOL 98.6 ERI  86.3  Indications:  A-flutter   PPM Follow Up Battery Voltage:  2.82 V     Battery Est. Longevity:  8-10 YRS     Pacer Dependent:  Yes       PPM Device Measurements Atrium  Amplitude: 2.0 mV, Impedance: 352 ohms, Threshold: 0.75 V at 0.4 msec Right Ventricle  Amplitude: 12.0 mV, Impedance: 439 ohms, Threshold: 0.750 V at 0.4 msec  Episodes Coumadin:  Yes  Parameters Mode:  DDDR     Lower Rate Limit:  70     Upper Rate Limit:  110 Paced AV Delay:  200     Sensed AV Delay:  170 Next Cardiology Appt Due:  10/26/2010 Tech Comments:  51 AMS EPISODES--LONGEST WAS 2 MIN 18 SECONDS. + WARFARIN.  NORMAL DEVICE FUNCTION. CHANGED RV OUTPUT FROM 1.125 TO 1.00 V (AUTOCAPTURE). ROV IN 6 MTHS W/DEVICE CLINIC. Vella Kohler  May 04, 2010 1:36 PM  MD Comments:  agree  Impression & Recommendations:  Problem # 1:  ATRIAL FIBRILLATION (ICD-427.31) He is doing well s/p atrial flutter ablation.  Though we do not have good documentation of afib, he continues to have occasional mode switches which are suggestive of afib.  The longest of these however is only 2 minutes. As he is doing so well, the patient requests that we not make any changes today.  His CHADS2 score is 2 (age, HTN).  We will continue coumadin for now and monitor for increased afib.  If he has any difficulty with bleeding, we may switch to ASA as he has had no prolonged afib.  Problem # 2:  HYPERTENSION, UNSPECIFIED (ICD-401.9) stable salt restriction  Problem # 3:  ATRIOVENTRICULAR BLOCK, MOBITZ TYPE II (ICD-426.12) normal pacemaker function as above

## 2010-06-03 NOTE — Medication Information (Signed)
Summary: ccr-lr  Anticoagulant Therapy  Managed by: Vashti Hey, RN PCP: Doreen Beam, MD Supervising MD: Diona Browner MD, Remi Deter Indication 1: Atrial Flutter (ICD-427.32) Lab [facility] [place] Site: [place] INR POC 1.6  Dietary changes: no    Health status changes: no    Bleeding/hemorrhagic complications: no    Recent/future hospitalizations: no    Any changes in medication regimen? no    Recent/future dental: no  Any missed doses?: yes     Details: Missed several doses  Is patient compliant with meds? yes       Allergies: 1)  ! Crestor  Anticoagulation Management History:      The patient is taking warfarin and comes in today for a routine follow up visit.  Positive risk factors for bleeding include an age of 75 years or older.  The bleeding index is 'intermediate risk'.  Positive CHADS2 values include History of HTN and Age > 55 years old.  The start date was 10/18/2008.  Anticoagulation responsible provider: Diona Browner MD, Remi Deter.  INR POC: 1.6.  Cuvette Lot#: [id number].  Exp: 10/11.    Anticoagulation Management Assessment/Plan:      The patient's current anticoagulation dose is Warfarin sodium 5 mg tabs: Use as directed by Anticoagulation Clinic.  The target INR is 2 - 3.  The next INR is due 06/16/2010.  Anticoagulation instructions were given to patient.  Results were reviewed/authorized by Vashti Hey, RN.  He was notified by Vashti Hey RN.         Prior Anticoagulation Instructions: INR 2.1 Continue coumadin 15mg  once daily   Current Anticoagulation Instructions: INR 1.6 Take coumadin 20mg  x 2 then resume 15mg  once daily

## 2010-06-16 ENCOUNTER — Encounter (INDEPENDENT_AMBULATORY_CARE_PROVIDER_SITE_OTHER): Payer: Medicare Other

## 2010-06-16 ENCOUNTER — Encounter: Payer: Self-pay | Admitting: Cardiology

## 2010-06-16 DIAGNOSIS — Z7901 Long term (current) use of anticoagulants: Secondary | ICD-10-CM

## 2010-06-16 DIAGNOSIS — I4891 Unspecified atrial fibrillation: Secondary | ICD-10-CM

## 2010-06-23 NOTE — Medication Information (Signed)
Summary: ccr-lr  Anticoagulant Therapy  Managed by: Vashti Hey, RN PCP: Doreen Beam, MD Supervising MD: Andee Lineman MD, Michelle Piper Indication 1: Atrial Flutter (ICD-427.32) Lab [facility] [place] Site: [place] INR POC 3.7  Dietary changes: no    Health status changes: no    Bleeding/hemorrhagic complications: no    Recent/future hospitalizations: no    Any changes in medication regimen? no    Recent/future dental: no  Any missed doses?: no       Is patient compliant with meds? yes       Allergies: 1)  ! Crestor  Anticoagulation Management History:      The patient is taking warfarin and comes in today for a routine follow up visit.  Positive risk factors for bleeding include an age of 75 years or older.  The bleeding index is 'intermediate risk'.  Positive CHADS2 values include History of HTN and Age > 35 years old.  The start date was 10/18/2008.  Anticoagulation responsible provider: Andee Lineman MD, Michelle Piper.  INR POC: 3.7.  Cuvette Lot#: [id number].  Exp: 10/11.    Anticoagulation Management Assessment/Plan:      The patient's current anticoagulation dose is Warfarin sodium 5 mg tabs: Use as directed by Anticoagulation Clinic.  The target INR is 2 - 3.  The next INR is due 07/14/2010.  Anticoagulation instructions were given to patient.  Results were reviewed/authorized by Vashti Hey, RN.  He was notified by Vashti Hey RN.         Prior Anticoagulation Instructions: INR 1.6 Take coumadin 20mg  x 2 then resume 15mg  once daily   Current Anticoagulation Instructions: INR 3.7 Hold coumadin tonight then resume 15mg  once daily

## 2010-06-29 ENCOUNTER — Ambulatory Visit (INDEPENDENT_AMBULATORY_CARE_PROVIDER_SITE_OTHER): Payer: Medicare Other | Admitting: Cardiology

## 2010-06-29 ENCOUNTER — Encounter: Payer: Self-pay | Admitting: Cardiology

## 2010-06-29 DIAGNOSIS — I4892 Unspecified atrial flutter: Secondary | ICD-10-CM

## 2010-06-29 DIAGNOSIS — I2510 Atherosclerotic heart disease of native coronary artery without angina pectoris: Secondary | ICD-10-CM

## 2010-07-07 NOTE — Miscellaneous (Signed)
  Clinical Lists Changes  Observations: Added new observation of PAST MED HX: MITRAL REGURGITATION,  mild... echo... February, 2010 HYPERLIPIDEMIA-MIXED (ICD-272.4) HYPERTENSION, UNSPECIFIED (ICD-401.9) ATRIOVENTRICULAR BLOCK, MOBITZ TYPE II (ICD-426.12) St. Jude dual-chamber permanent pacemaker.   June 21, 2008 ATRIAL FLUTTER (ICD-427.32)   flutter ablation  October, 2010 CAD,  non-STEMI.... May 31, 2008 CABG   June 17, 2008.Marland KitchenMarland KitchenMarland KitchenMarland Kitchen 5 vessel EF   45%... echo... February, 2010  /  EF  60%.... TEE.... October, 2010 PFO   small... TEE... October, 2010 (06/29/2010 12:58) Added new observation of PRIMARY MD: Doreen Beam, MD (06/29/2010 12:58)       Past History:  Past Medical History: MITRAL REGURGITATION,  mild... echo... February, 2010 HYPERLIPIDEMIA-MIXED (ICD-272.4) HYPERTENSION, UNSPECIFIED (ICD-401.9) ATRIOVENTRICULAR BLOCK, MOBITZ TYPE II (ICD-426.12) St. Jude dual-chamber permanent pacemaker.   June 21, 2008 ATRIAL FLUTTER (ICD-427.32)   flutter ablation  October, 2010 CAD,  non-STEMI.... May 31, 2008 CABG   June 17, 2008.Marland KitchenMarland KitchenMarland KitchenMarland Kitchen 5 vessel EF   45%... echo... February, 2010  /  EF  60%.... TEE.... October, 2010 PFO   small... TEE... October, 2010

## 2010-07-07 NOTE — Letter (Signed)
Summary: Discharge [facility]  Discharge [facility]   Imported By: Dorise Hiss 06/29/2010 11:30:14  _____________________________________________________________________  External Attachment:    Type:   Image     Comment:   External Document

## 2010-07-07 NOTE — Assessment & Plan Note (Signed)
Summary: PAST DUE FOR F/U-LA/TR   Visit Type:  Follow-up Primary Provider:  Doreen Beam, MD  CC:  CAD.  History of Present Illness: The patient is seen for followup of coronary artery disease, atrial flutter, bradycardia.  He is a very active gentleman. He  presented with atrial flutter and a non-STEMI in February, 2010. he underwent CABG February, 2010.  At that time his ejection fraction was in the 45% range.  Later a TEE revealed an ejection fraction of 60% in October, 2010.  He received a pacemaker during his hospitalization for CABG.  Then later he received a flutter ablation in October, 2010.  He's done extremely well.  His head a few mode switches which suggests that he may still have some atrial fib.  On this basis he is kept on Coumadin as long as he is stable.  Preventive Screening-Counseling & Management  Alcohol-Tobacco     Smoking Status: never  Current Medications (verified): 1)  Metoprolol Tartrate 25 Mg Tabs (Metoprolol Tartrate) .... Take One Tablet By Mouth Twice A Day 2)  Warfarin Sodium 5 Mg Tabs (Warfarin Sodium) .... Use As Directed By Anticoagulation Clinic 3)  Aspirin 81 Mg Tbec (Aspirin) .... Take One Tablet By Mouth Daily 4)  Docusate Sodium 100 Mg Caps (Docusate Sodium) .... Two Times A Day  Allergies (verified): 1)  ! Crestor  Comments:  Nurse/Medical Assistant: The patient is currently on medications but does not know the name or dosage at this time. Instructed to contact our office with details. Will update medication list at that time.  Past History:  Past Medical History: MITRAL REGURGITATION,  mild... echo... February, 2010 HYPERLIPIDEMIA-MIXED (ICD-272.4) HYPERTENSION, UNSPECIFIED (ICD-401.9) ATRIOVENTRICULAR BLOCK, MOBITZ TYPE II (ICD-426.12) St. Jude dual-chamber permanent pacemaker.   June 21, 2008 ATRIAL FLUTTER (ICD-427.32)   flutter ablation  October, 2010 CAD,  non-STEMI.... May 31, 2008 CABG   June 17, 2008.Marland KitchenMarland KitchenMarland KitchenMarland Kitchen 5 vessel EF    45%... echo... February, 2010  /  EF  60%.... TEE.... October, 2010 PFO   small... TEE... October, 2010 Coumadin therapy   ( in frequent mode switches February, 2012)  Review of Systems       Patient denies fever, chills, headache, sweats, rash, change in vision, change in hearing, chest pain, cough, nausea vomiting, urinary symptoms.  All other systems are reviewed and are negative.  Vital Signs:  Patient profile:   77 year old male Height:      70 inches Weight:      200 pounds Pulse rate:   76 / minute BP sitting:   141 / 86  (left arm) Cuff size:   regular  Vitals Entered By: Carlye Grippe (June 29, 2010 3:30 PM)  Physical Exam  General:  patient is quite stable. Head:  head is atraumatic. Eyes:  no xanthelasma. Neck:  no jugular venous distention. Chest Wall:  no chest wall tenderness. Lungs:  lungs are clear.  Respiratory effort is nonlabored. Heart:  cardiac exam reveals an S1 and S2.  No clicks or significant murmurs. Abdomen:  abdomen is soft. Msk:  no musculoskeletal deformities. Extremities:  no peripheral edema. Skin:  no skin rashes. Psych:  patient is oriented to person time and place.  Affect is normal.   PPM Specifications Following MD:  Sherryl Manges, MD     PPM Vendor:  St Jude     PPM Model Number:  [phone]     PPM Serial Number:  [id number] PPM DOI:  06/21/2008  PPM Implanting MD:  Hillis Range, MD  Lead 1    Location: [facility]     DOI: 06/21/2008     Model #: [id number]     Serial #: [id number]     Status: active Lead 2    Location: [facility]     DOI: 06/21/2008     Model #: [id number]     Serial #: [id number]     Status: active  Magnet Response Rate:  BOL 98.6 ERI  86.3  Indications:  A-flutter   PPM Follow Up Pacer Dependent:  Yes      Episodes Coumadin:  Yes  Parameters Mode:  DDDR     Lower Rate Limit:  70     Upper Rate Limit:  110 Paced AV Delay:  200     Sensed AV Delay:  170  Impression & Recommendations:  Problem # 1:  ATRIAL FIBRILLATION  (ICD-427.31) The patient may have very infrequent atrial fibrillation.  EKG is done today and reviewed by me.  He has dual-chamber pacing.  He is to continue on Coumadin.  Problem # 2:  HYPERTENSION, UNSPECIFIED (ICD-401.9) blood pressure is controlled today.  No change in therapy.  Problem # 3:  ATRIAL FLUTTER (ICD-427.32) The patient's atrial flutter has been ablated.  No further workup.  Problem # 4:  CAD, NATIVE VESSEL (ICD-414.01) Coronary disease is stable.  EKG is done today and reviewed by me.  The rhythmis paced.  As part of today's evaluation I have reviewed old records over several years.  I have not personally seen him for several years.  I reviewed his pacing notes and other records.  I reviewed his CABG history and his echo reports.  Overall he is quite stable.  We'll see him back in one year for followup.  Other Orders: EKG w/ Interpretation (93000)  Patient Instructions: 1)  Your physician wants you to follow-up in: 1 year. You will receive a reminder letter in the mail one-two months in advance. If you don't receive a letter, please call our office to schedule the follow-up appointment. 2)  Your physician recommends that you continue on your current medications as directed. Please refer to the Current Medication list given to you today.

## 2010-07-10 ENCOUNTER — Encounter: Payer: Self-pay | Admitting: Internal Medicine

## 2010-07-10 DIAGNOSIS — I4892 Unspecified atrial flutter: Secondary | ICD-10-CM

## 2010-07-10 DIAGNOSIS — I4891 Unspecified atrial fibrillation: Secondary | ICD-10-CM

## 2010-07-10 DIAGNOSIS — Z7901 Long term (current) use of anticoagulants: Secondary | ICD-10-CM | POA: Insufficient documentation

## 2010-07-14 ENCOUNTER — Ambulatory Visit (INDEPENDENT_AMBULATORY_CARE_PROVIDER_SITE_OTHER): Payer: Medicare Other | Admitting: *Deleted

## 2010-07-14 DIAGNOSIS — I4892 Unspecified atrial flutter: Secondary | ICD-10-CM

## 2010-07-14 DIAGNOSIS — I4891 Unspecified atrial fibrillation: Secondary | ICD-10-CM

## 2010-07-14 DIAGNOSIS — Z7901 Long term (current) use of anticoagulants: Secondary | ICD-10-CM

## 2010-07-14 LAB — POCT INR: INR: 2.3

## 2010-07-14 NOTE — Patient Instructions (Signed)
Continue coumadin 15mg  daily

## 2010-07-30 LAB — CBC
Hemoglobin: 13.3 g/dL (ref 13.0–17.0)
MCHC: 33.6 g/dL (ref 30.0–36.0)
RBC: 4.08 MIL/uL — ABNORMAL LOW (ref 4.22–5.81)
WBC: 8.2 10*3/uL (ref 4.0–10.5)

## 2010-07-30 LAB — BASIC METABOLIC PANEL
CO2: 29 mEq/L (ref 19–32)
Calcium: 8.8 mg/dL (ref 8.4–10.5)
Creatinine, Ser: 0.94 mg/dL (ref 0.4–1.5)
GFR calc Af Amer: >60 mL/min (ref >60)
GFR calc non Af Amer: >60 mL/min (ref >60)
Sodium: 139 mEq/L (ref 135–145)

## 2010-07-30 LAB — PROTIME-INR
INR: 2.22 — ABNORMAL HIGH (ref 0.00–1.49)
INR: 2.25 — ABNORMAL HIGH (ref 0.00–1.49)
Prothrombin Time: 24.4 seconds — ABNORMAL HIGH (ref 11.6–15.2)
Prothrombin Time: 24.7 s — ABNORMAL HIGH (ref 11.6–15.2)

## 2010-07-30 LAB — APTT: aPTT: 32 seconds (ref 24–37)

## 2010-08-03 ENCOUNTER — Other Ambulatory Visit: Payer: Self-pay | Admitting: Cardiology

## 2010-08-11 ENCOUNTER — Ambulatory Visit (INDEPENDENT_AMBULATORY_CARE_PROVIDER_SITE_OTHER): Payer: Medicare Other | Admitting: *Deleted

## 2010-08-11 DIAGNOSIS — I4891 Unspecified atrial fibrillation: Secondary | ICD-10-CM

## 2010-08-11 DIAGNOSIS — Z7901 Long term (current) use of anticoagulants: Secondary | ICD-10-CM

## 2010-08-11 DIAGNOSIS — I4892 Unspecified atrial flutter: Secondary | ICD-10-CM

## 2010-08-11 LAB — POCT I-STAT 3, ART BLOOD GAS (G3+)
Acid-base deficit: 4 mmol/L — ABNORMAL HIGH (ref 0.0–2.0)
Bicarbonate: 23.5 mEq/L (ref 20.0–24.0)
Bicarbonate: 23.6 mEq/L (ref 20.0–24.0)
O2 Saturation: 99 %
Patient temperature: 36.8
TCO2: 25 mmol/L (ref 0–100)
TCO2: 27 mmol/L (ref 0–100)
pCO2 arterial: 31.8 mmHg — ABNORMAL LOW (ref 35.0–45.0)
pCO2 arterial: 37.5 mmHg (ref 35.0–45.0)
pCO2 arterial: 39.2 mmHg (ref 35.0–45.0)
pH, Arterial: 7.406 (ref 7.350–7.450)
pH, Arterial: 7.433 (ref 7.350–7.450)
pH, Arterial: 7.435 (ref 7.350–7.450)
pH, Arterial: 7.47 — ABNORMAL HIGH (ref 7.350–7.450)
pO2, Arterial: 63 mmHg — ABNORMAL LOW (ref 80.0–100.0)

## 2010-08-11 LAB — CBC
HCT: 32.2 % — ABNORMAL LOW (ref 39.0–52.0)
HCT: 35 % — ABNORMAL LOW (ref 39.0–52.0)
HCT: 37.5 % — ABNORMAL LOW (ref 39.0–52.0)
Hemoglobin: 10.9 g/dL — ABNORMAL LOW (ref 13.0–17.0)
MCHC: 34.3 g/dL (ref 30.0–36.0)
MCHC: 34.5 g/dL (ref 30.0–36.0)
MCHC: 34.5 g/dL (ref 30.0–36.0)
MCHC: 34.6 g/dL (ref 30.0–36.0)
MCHC: 34.6 g/dL (ref 30.0–36.0)
MCHC: 34.8 g/dL (ref 30.0–36.0)
MCHC: 35 g/dL (ref 30.0–36.0)
MCV: 92.9 fL (ref 78.0–100.0)
MCV: 93.8 fL (ref 78.0–100.0)
MCV: 93.9 fL (ref 78.0–100.0)
MCV: 94 fL (ref 78.0–100.0)
MCV: 94.2 fL (ref 78.0–100.0)
MCV: 94.4 fL (ref 78.0–100.0)
MCV: 94.5 fL (ref 78.0–100.0)
Platelets: 157 10*3/uL (ref 150–400)
Platelets: 124 10*3/uL — ABNORMAL LOW (ref 150–400)
Platelets: 129 10*3/uL — ABNORMAL LOW (ref 150–400)
Platelets: 143 10*3/uL — ABNORMAL LOW (ref 150–400)
Platelets: 150 10*3/uL (ref 150–400)
Platelets: 158 10*3/uL (ref 150–400)
Platelets: 175 10*3/uL (ref 150–400)
Platelets: 180 10*3/uL (ref 150–400)
Platelets: 231 10*3/uL (ref 150–400)
RBC: 4.02 MIL/uL — ABNORMAL LOW (ref 4.22–5.81)
RBC: 3.33 MIL/uL — ABNORMAL LOW (ref 4.22–5.81)
RBC: 3.4 MIL/uL — ABNORMAL LOW (ref 4.22–5.81)
RBC: 3.61 MIL/uL — ABNORMAL LOW (ref 4.22–5.81)
RDW: 12.9 % (ref 11.5–15.5)
RDW: 13 % (ref 11.5–15.5)
RDW: 13.1 % (ref 11.5–15.5)
RDW: 13.3 % (ref 11.5–15.5)
RDW: 13.8 % (ref 11.5–15.5)
RDW: 14 % (ref 11.5–15.5)
RDW: 14.5 % (ref 11.5–15.5)
WBC: 8.9 10*3/uL (ref 4.0–10.5)
WBC: 7.6 10*3/uL (ref 4.0–10.5)
WBC: 8.7 10*3/uL (ref 4.0–10.5)
WBC: 9.7 10*3/uL (ref 4.0–10.5)
WBC: 9.7 10*3/uL (ref 4.0–10.5)

## 2010-08-11 LAB — BLOOD GAS, ARTERIAL
Acid-base deficit: 0.2 mmol/L (ref 0.0–2.0)
Patient temperature: 98.6
TCO2: 24.3 mmol/L (ref 0–100)
pCO2 arterial: 33.7 mmHg — ABNORMAL LOW (ref 35.0–45.0)

## 2010-08-11 LAB — POCT I-STAT GLUCOSE
Glucose, Bld: 101 mg/dL — ABNORMAL HIGH (ref 70–99)
Operator id: 140821

## 2010-08-11 LAB — BASIC METABOLIC PANEL
BUN: 10 mg/dL (ref 6–23)
BUN: 15 mg/dL (ref 6–23)
BUN: 15 mg/dL (ref 6–23)
BUN: 17 mg/dL (ref 6–23)
CO2: 24 mEq/L (ref 19–32)
Calcium: 7.5 mg/dL — ABNORMAL LOW (ref 8.4–10.5)
Calcium: 8.2 mg/dL — ABNORMAL LOW (ref 8.4–10.5)
Calcium: 8.5 mg/dL (ref 8.4–10.5)
Chloride: 99 mEq/L (ref 96–112)
Creatinine, Ser: 0.81 mg/dL (ref 0.4–1.5)
Creatinine, Ser: 0.86 mg/dL (ref 0.4–1.5)
Creatinine, Ser: 0.95 mg/dL (ref 0.4–1.5)
Creatinine, Ser: 1.03 mg/dL (ref 0.4–1.5)
Creatinine, Ser: 1.06 mg/dL (ref 0.4–1.5)
GFR calc Af Amer: >60 mL/min (ref >60)
GFR calc non Af Amer: >60 mL/min (ref >60)
GFR calc non Af Amer: >60 mL/min (ref >60)
GFR calc non Af Amer: >60 mL/min (ref >60)
GFR calc non Af Amer: >60 mL/min (ref >60)
Glucose, Bld: 100 mg/dL — ABNORMAL HIGH (ref 70–99)
Glucose, Bld: 105 mg/dL — ABNORMAL HIGH (ref 70–99)
Glucose, Bld: 106 mg/dL — ABNORMAL HIGH (ref 70–99)
Glucose, Bld: 98 mg/dL (ref 70–99)
Potassium: 4.2 mEq/L (ref 3.5–5.1)
Potassium: 4.2 mEq/L (ref 3.5–5.1)
Sodium: 137 mEq/L (ref 135–145)

## 2010-08-11 LAB — POCT INR: INR: 2.4

## 2010-08-11 LAB — ABO/RH: ABO/RH(D): B NEG

## 2010-08-11 LAB — POCT I-STAT, CHEM 8
Creatinine, Ser: 0.8 mg/dL (ref 0.4–1.5)
Glucose, Bld: 129 mg/dL — ABNORMAL HIGH (ref 70–99)
Hemoglobin: 11.2 g/dL — ABNORMAL LOW (ref 13.0–17.0)
TCO2: 22 mmol/L (ref 0–100)

## 2010-08-11 LAB — HEPARIN LEVEL (UNFRACTIONATED)
Heparin Unfractionated: 0.12 IU/mL — ABNORMAL LOW (ref 0.30–0.70)
Heparin Unfractionated: 0.3 IU/mL (ref 0.30–0.70)
Heparin Unfractionated: 0.31 IU/mL (ref 0.30–0.70)
Heparin Unfractionated: 0.32 IU/mL (ref 0.30–0.70)
Heparin Unfractionated: 0.42 IU/mL (ref 0.30–0.70)
Heparin Unfractionated: 0.43 IU/mL (ref 0.30–0.70)

## 2010-08-11 LAB — CARDIAC PANEL(CRET KIN+CKTOT+MB+TROPI)
CK, MB: 118.3 ng/mL — ABNORMAL HIGH (ref 0.3–4.0)
CK, MB: 63.1 ng/mL — ABNORMAL HIGH (ref 0.3–4.0)
Relative Index: 14.5 — ABNORMAL HIGH (ref 0.0–2.5)
Relative Index: 15.1 — ABNORMAL HIGH (ref 0.0–2.5)
Relative Index: 18.3 — ABNORMAL HIGH (ref 0.0–2.5)
Total CK: 786 U/L — ABNORMAL HIGH (ref 7–232)

## 2010-08-11 LAB — URINALYSIS, ROUTINE W REFLEX MICROSCOPIC
Bilirubin Urine: NEGATIVE
Hgb urine dipstick: NEGATIVE
Ketones, ur: NEGATIVE mg/dL
Leukocytes, UA: NEGATIVE
Nitrite: NEGATIVE
Nitrite: NEGATIVE
Protein, ur: NEGATIVE mg/dL
Urobilinogen, UA: 0.2 mg/dL (ref 0.0–1.0)
pH: 5.5 (ref 5.0–8.0)

## 2010-08-11 LAB — POCT I-STAT 4, (NA,K, GLUC, HGB,HCT)
Glucose, Bld: 103 mg/dL — ABNORMAL HIGH (ref 70–99)
Glucose, Bld: 121 mg/dL — ABNORMAL HIGH (ref 70–99)
Glucose, Bld: 123 mg/dL — ABNORMAL HIGH (ref 70–99)
Glucose, Bld: 136 mg/dL — ABNORMAL HIGH (ref 70–99)
Glucose, Bld: 97 mg/dL (ref 70–99)
HCT: 30 % — ABNORMAL LOW (ref 39.0–52.0)
HCT: 32 % — ABNORMAL LOW (ref 39.0–52.0)
HCT: 33 % — ABNORMAL LOW (ref 39.0–52.0)
HCT: 34 % — ABNORMAL LOW (ref 39.0–52.0)
Hemoglobin: 10.9 g/dL — ABNORMAL LOW (ref 13.0–17.0)
Hemoglobin: 11.2 g/dL — ABNORMAL LOW (ref 13.0–17.0)
Hemoglobin: 9.2 g/dL — ABNORMAL LOW (ref 13.0–17.0)
Potassium: 3.5 mEq/L (ref 3.5–5.1)
Potassium: 4.8 mEq/L (ref 3.5–5.1)
Potassium: 4.9 mEq/L (ref 3.5–5.1)
Potassium: 5.2 mEq/L — ABNORMAL HIGH (ref 3.5–5.1)
Sodium: 135 mEq/L (ref 135–145)
Sodium: 139 mEq/L (ref 135–145)

## 2010-08-11 LAB — TYPE AND SCREEN: ABO/RH(D): B NEG

## 2010-08-11 LAB — COMPREHENSIVE METABOLIC PANEL
ALT: 23 U/L (ref 0–53)
AST: 77 U/L — ABNORMAL HIGH (ref 0–37)
Albumin: 3.3 g/dL — ABNORMAL LOW (ref 3.5–5.2)
Alkaline Phosphatase: 71 U/L (ref 39–117)
CO2: 27 mEq/L (ref 19–32)
Chloride: 102 mEq/L (ref 96–112)
Creatinine, Ser: 0.91 mg/dL (ref 0.4–1.5)
GFR calc Af Amer: >60 mL/min (ref >60)
GFR calc non Af Amer: >60 mL/min (ref >60)
Potassium: 3.9 mEq/L (ref 3.5–5.1)
Sodium: 137 mEq/L (ref 135–145)
Total Bilirubin: 0.7 mg/dL (ref 0.3–1.2)

## 2010-08-11 LAB — LIPID PANEL
HDL: 28 mg/dL — ABNORMAL LOW (ref >39)
LDL Cholesterol: 140 mg/dL — ABNORMAL HIGH (ref 0–99)
Triglycerides: 113 mg/dL (ref <150)
VLDL: 23 mg/dL (ref 0–40)

## 2010-08-11 LAB — GLUCOSE, CAPILLARY
Glucose-Capillary: 106 mg/dL — ABNORMAL HIGH (ref 70–99)
Glucose-Capillary: 108 mg/dL — ABNORMAL HIGH (ref 70–99)
Glucose-Capillary: 111 mg/dL — ABNORMAL HIGH (ref 70–99)
Glucose-Capillary: 114 mg/dL — ABNORMAL HIGH (ref 70–99)
Glucose-Capillary: 137 mg/dL — ABNORMAL HIGH (ref 70–99)
Glucose-Capillary: 89 mg/dL (ref 70–99)

## 2010-08-11 LAB — URINE MICROSCOPIC-ADD ON

## 2010-08-11 LAB — PROTIME-INR
INR: 1 (ref 0.00–1.49)
Prothrombin Time: 13.4 seconds (ref 11.6–15.2)

## 2010-08-11 LAB — CREATININE, SERUM
Creatinine, Ser: 0.79 mg/dL (ref 0.4–1.5)
GFR calc Af Amer: >60 mL/min (ref >60)

## 2010-08-11 LAB — BRAIN NATRIURETIC PEPTIDE: Pro B Natriuretic peptide (BNP): 216 pg/mL — ABNORMAL HIGH (ref 0.0–100.0)

## 2010-08-11 LAB — MAGNESIUM: Magnesium: 2.3 mg/dL (ref 1.5–2.5)

## 2010-08-11 LAB — APTT: aPTT: 73 seconds — ABNORMAL HIGH (ref 24–37)

## 2010-08-11 LAB — PLATELET COUNT: Platelets: 134 10*3/uL — ABNORMAL LOW (ref 150–400)

## 2010-09-08 ENCOUNTER — Ambulatory Visit (INDEPENDENT_AMBULATORY_CARE_PROVIDER_SITE_OTHER): Payer: Medicare Other | Admitting: *Deleted

## 2010-09-08 ENCOUNTER — Other Ambulatory Visit: Payer: Self-pay | Admitting: Cardiology

## 2010-09-08 DIAGNOSIS — Z7901 Long term (current) use of anticoagulants: Secondary | ICD-10-CM

## 2010-09-08 DIAGNOSIS — I4892 Unspecified atrial flutter: Secondary | ICD-10-CM

## 2010-09-08 DIAGNOSIS — I4891 Unspecified atrial fibrillation: Secondary | ICD-10-CM

## 2010-09-08 NOTE — Assessment & Plan Note (Signed)
OFFICE VISIT   Langworthy, Rachael L  DOB:  [birth year]                                        July 09, 2008  CHART #:  [id number]   The patient returned today for followup status post coronary artery  bypass graft surgery on 06/17/2008.  He also had Mobitz type 2 heart  block and underwent insertion of a permanent dual-chamber pacemaker by  Dr. Hillis Range.  His postoperative course was uncomplicated.  Since  discharge, he said that he feels fairly well, but his energy level is  not back to normal.  He also reports constipation, which has been a  chronic problem for him even before surgery.   PHYSICAL EXAMINATION:  VITAL SIGNS:  Today, his blood pressure is  119/71, pulse is 70 and regular, his respiratory rate is 18 and  unlabored.  Oxygen saturation is 97% on room air.  GENERAL:  He looks well.  CARDIAC:  A regular rate and rhythm with normal heart sounds.  LUNGS:  Clear.  Chest incision is healing well and the sternum is  stable.  EXTREMITIES:  His right leg incisions are healing well.  There is  moderate edema in the right leg from the knee down, which he said is  improved from last week.  There is no significant edema in the left leg.   Followup chest x-Maceo today shows a very small bilateral pleural  effusions that have improved on the right and are stable on the left  from previous x-Yasseen on 06/22/2008.   MEDICATIONS:  Crestor 40 mg daily, Lopressor 25 mg b.i.d., and oxycodone  1 per day.   IMPRESSION:  Overall, the patient is making a good recovery following  surgery, especially considering his advanced age.  I encouraged him to  continue walking as much as possible.  I told him he can return to  driving a car, but should refrain from lifting anything heavier than 10  pounds for a total of 3 months from date of surgery.  He will stop using  the oxycodone and switch to Tylenol p.r.n. for pain since he is not  having much discomfort.  This should help  his  constipation.  He has a followup appointment later today with Dr. Myrtis Ser  and will contact me if he develops any problems with his incisions.   Evelene Croon, M.D.  Electronically Signed   BB/MEDQ  D:  07/09/2008  T:  07/09/2008  Job:  [id number]

## 2010-09-08 NOTE — Op Note (Signed)
Stephen Olsen, Stephen Olsen                 ACCOUNT NO.:  [id number]   MEDICAL RECORD NO.:  [id number]          PATIENT TYPE:  INP   LOCATION:  2309                         FACILITY:  [facility]   PHYSICIAN:  Evelene Croon, M.D.     DATE OF BIRTH:  03-11-1925   DATE OF PROCEDURE:  06/17/2008  DATE OF DISCHARGE:                               OPERATIVE REPORT   PREOPERATIVE DIAGNOSIS:  Severe 3-vessel coronary disease status post  non-ST-segment elevation myocardial infarction.   POSTOPERATIVE DIAGNOSIS:  Severe 3-vessel coronary disease status post  non-ST-segment elevation myocardial infarction.   OPERATIVE PROCEDURE:  Median sternotomy, extracorporeal circulation,  coronary bypass graft surgery x5 using a left internal mammary artery  graft to left anterior descending coronary artery, with a sequential  saphenous vein graft to the first and second diagonal branches of the  left anterior descending coronary artery, a saphenous vein graft to  obtuse marginal branch of the left circumflex coronary artery, and a  saphenous vein graft to the posterior descending branch of the right  coronary artery.  Endoscopic vein harvesting from the right leg.   ATTENDING SURGEON:  Evelene Croon, MD   ASSISTANT:  Doree Fudge, PA-C   ANESTHESIA:  General endotracheal.   CLINICAL HISTORY:  This patient is an 76 year old gentleman with history  of hypertension and hyperlipidemia as well as previous coronary artery  disease and myocardial infarction treated in the distant past with  medical therapy.  He recently presented with exertional chest pain and  shortness of breath with a prolonged episode.  He presented to [facility] where he ruled in for a non-ST-segment elevation MI.  He was  transferred to [facility] and underwent cardiac catheterization by Dr.  Riley Kill.  This showed severe 3-vessel disease.  The LAD had 50 and 70%  proximal and mid vessel stenosis.  There was 95% proximal stenosis of  the first diagonal branch.  This diagonal bifurcated and there was  complete occlusion of the medial subbranch of this vessel which I  designated as the second diagonal branch.  This filled by collaterals  faintly.  There is also an 80% ostial stenosis of the left circumflex  coronary artery extending into a 95% proximal stenosis.  There was a  single large dominant marginal branch.  There are left-to-right  collaterals filling the terminal branches of the right coronary system.  The right coronary has 70% proximal stenosis and 100% occlusion in the  mid vessel.  Left ventricular ejection fraction was about 40% with trace  mitral regurgitation.  After review of the angiogram and examination of  the patient, it was felt that coronary bypass graft surgery was the best  treatment to prevent further ischemia and infarction.  The patient was  loaded with Plavix at his initial presentation at [facility] and  therefore surgery was delayed for about 5 days for Plavix washout.  The  operative procedure was discussed with the patient including  alternatives, benefits, and risks including but not limited to bleeding,  blood transfusion, infection, stroke, myocardial infarction, graft  failure, and death.  He  understood and agreed to proceed.   OPERATIVE PROCEDURE:  The patient was taken to the operating room and  placed on the table in a supine position.  After induction of general  endotracheal anesthesia, a Foley catheter was placed in bladder using  sterile technique.  Then, the chest, abdomen, and both lower extremities  were prepped and draped in the usual sterile manner.  The chest was  entered through a median sternotomy incision and the pericardium was  opened in midline.  Examination of heart showed good ventricular  contractility.  The ascending aorta had no palpable plaques in it.   Then, the left internal mammary artery was harvested from the chest wall  as a pedicle graft.  This  was a medium caliber vessel with excellent  blood flow through it.  At the same time, a segment of greater saphenous  vein was harvested from the right leg using endoscopic vein harvest  technique.  This vein was of medium size and good quality.   The patient was heparinized and when an adequate ACT was obtained, the  distal ascending aorta was cannulated using a 20-French aortic cannula  for arterial inflow.  Venous outflow was achieved using 2-stage venous  cannula to the right atrial appendage.  An antegrade cardioplegia and  vent cannula was inserted in the aortic root.   The patient was placed on cardiopulmonary bypass and distal coronaries  were identified.  The LAD was intramyocardial in its proximal portion.  This came to the surface in the midportion where it was diffusely  diseased.  The distal vessel had minimal disease and was suitable for  grafting.  The 2 diagonal branches were located.  The first diagonal was  intramyocardial, was located, and was a moderate-size graftable vessel.  The more medial subbranch of this which I designated as the second  diagonal branch was visible on the surface of the heart and moderately  diseased in its proximal portion.  Beyond this, proximal portion of the  vessel was suitable for grafting.  The left circumflex gave off a single  large marginal branch which was also intramyocardial for most of its  extent.  I was able to localize this proximally and traced it slightly  more distal into the muscle where it was a large graftable vessel.  The  right coronary artery was diffusely diseased.  It gave off posterior  descending branch which was also diffusely diseased with plaque.  Distally in the posterior descending branch, the vessel was softer and  suitable for grafting.  The posterolateral branch was smaller and  diffusely diseased and not felt to be graftable.   Then, the aorta was cross-clamped and 1000 mL of cold blood antegrade   cardioplegia was administered in the aortic root with quick arrest of  the heart.  Systemic hypothermia to 28 degrees centigrade and topical  hypothermia with iced saline was used.  A temperature probe was placed  in septum and insulating pad in the pericardium.   The first distal anastomosis was performed to the obtuse marginal  branch.  The internal diameter was about 2 mm.  Conduit used was a  segment of greater saphenous vein and the anastomosis was performed in  an end-to-side manner using continuous 7-0 Prolene suture.  Flow was  noted through the graft and was excellent.   The second distal anastomosis was performed to the posterior descending  coronary artery.  The internal diameter distally was about 1.6 mm.  Conduit used was  a second segment of greater saphenous vein.  Anastomosis performed in an end-to-side manner using continuous 7-0  Prolene suture.  Flow was noted through the graft and was excellent.  Then, another dose of cardioplegia given down the vein grafts and into  the aortic root.   The third distal anastomosis was performed to the first diagonal branch.  The internal diameter was 1.6 mm.  Conduit used was a third segment of  greater saphenous vein and the anastomosis performed a sequential side-  to-side manner using continuous 7-0 Prolene suture.  Flow was noted  through the graft and was excellent.   The fourth distal anastomosis was performed to the second diagonal  branch.  The internal diameter was also about 1.6 mm.  Conduit used was  the same segment of greater saphenous vein and the anastomosis performed  in a sequential end-to-side manner using continuous 7-0 Prolene suture.  Flow was noted through the graft and was excellent.  Then, another dose  of cardioplegia was given down the vein grafts and into the aortic root.   The fifth distal anastomosis was then performed to the distal LAD.  The  internal diameter here was about 1.75 mm.  The conduit used  was the left  internal mammary graft that was brought through an opening in the left  pericardium anterior the phrenic nerve.  It anastomosed to the LAD in an  end-to-side manner using continuous 8-0 Prolene suture.  The pedicle was  sutured to the epicardium with 6-0 Prolene sutures.  Then with the cross-  clamp in place, the 3 proximal vein graft anastomoses were performed to  the aortic root in an end-to-side manner using continuous 6-0 Prolene  suture.  Then, the clamp was removed from mammary pedicle.  There was  rapid warming of the ventricular septum and return of spontaneous  ventricular fibrillation.  The cross-clamp removed with a time of 85  minutes and the patient spontaneously converted to sinus rhythm.  The  proximal and distal anastomoses were examined and were hemostatic and  allowed the grafts satisfactory.  Graft marker was placed around the  proximal anastomoses.  Two temporary right ventricular and right atrial  pacing wires placed and brought out through the skin.   When the patient rewarmed to 37 degrees centigrade, he was weaned from  cardiopulmonary bypass on no inotropic agents.  Total bypass time was  100 minutes.  Cardiac function appeared excellent with cardiac output of  5.5 L per minute.  Protamine was given and the venous and aortic cannula  was removed without difficulty.  Hemostasis was achieved.  Three chest  tubes were placed with the tube in the posterior pericardium, one in  left pleural space, and one in the anterior mediastinum.  The  pericardium was reapproximated over the heart.  Sternum was closed #6  stainless steel wires.  The fascia was closed with continuous #1 Vicryl  suture.  Subcutaneous tissue was closed with continuous 2-0 Vicryl, and  the skin with a 3-0 Vicryl subcuticular closure.  Lower extremity vein  harvest site was closed in layers in similar manner.  The sponge,  needle, and instrument counts were correct according to the scrub  nurse.  Dry sterile dressing applied over the incisions around the chest tubes  with Pleur-Evac suction.  The patient remained hemodynamically stable  and was transported to the SICU in guarded but stable condition.       Evelene Croon, M.D.  Electronically Signed  BB/MEDQ  D:  06/17/2008  T:  06/18/2008  Job:  [id number]   cc:   Arturo Morton. Riley Kill, MD, [facility]

## 2010-09-08 NOTE — Consult Note (Signed)
NAME:  URI, TURNBOUGH                 ACCOUNT NO.:  [id number]   MEDICAL RECORD NO.:  [id number]          PATIENT TYPE:  INP   LOCATION:  4707                         FACILITY:  [facility]   PHYSICIAN:  Salvatore Decent. Cornelius Moras, M.D. DATE OF BIRTH:  11-07-1924   DATE OF CONSULTATION:  06/10/2008  DATE OF DISCHARGE:                                 CONSULTATION   REQUESTING PHYSICIAN:  Arturo Morton. Riley Kill, MD, [facility]   PRIMARY CARDIOLOGIST:  Luis Abed, MD, [facility]   PRIMARY CARE PHYSICIAN:  Doreen Beam, MD   REASON FOR CONSULTATION:  Severe 3-vessel coronary artery disease status  post acute non-ST-segment elevation myocardial infarction.   HISTORY OF PRESENT ILLNESS:  Mr. Handy is an 77 year old gentleman who  lives in [place], [place].  He has a longstanding history of coronary  artery disease dating back more than 20 years ago when he apparently  suffered a myocardial infarction.  He underwent cardiac catheterization  at that time at [facility] in [place].  He  was treated medically without intervention.  He had done quite well  until recently.  He states that approximately 3 weeks ago he first began  to develop episodes of substernal chest pressure associated with  shortness of breath that occurred with physical activity.  The patient  is quite active physically, and when these episodes occur, he would stop  and take a nitroglycerin and usually within a period of 15-20 minutes,  the symptoms would resolve.  Over 3 weeks, his symptoms continued  intermittently until the morning of June 09, 2008, when he had  prolonged episode of substernal chest pain associated with resting  shortness of breath, that persisted.  He was brought to [facility] in [place] where baseline electrocardiogram was notable for normal  sinus rhythm with some dynamic changes in the anterolateral lead, but no  acute ST-segment elevation was appreciated.  Cardiac enzymes were  abnormal,  consistent with non-ST-segment elevation myocardial  infarction.  The patient was loaded with Plavix, started on intravenous  heparin, nitroglycerin.  The patient was transferred to [facility] where he underwent cardiac catheterization by Dr.  Riley Kill on June 10, 2008.  He was found to have severe 3-vessel  coronary artery disease with mild-to-moderate left ventricular  dysfunction.  Cardiothoracic surgical consultation was requested.   REVIEW OF SYSTEMS:  GENERAL:  The patient has been quite active  physically for all of his life until now.  His appetite has been normal.  He has not been gaining or losing weight.  CARDIAC:  Notable for  symptoms as described per the history above.  Prior to recent onset of  episodes of chest pain, the patient denies any symptoms of shortness of  breath either with activity or at rest.  The patient denies PND,  orthopnea, lower extremity edema.  He has not had tachy palpitations nor  syncope.  RESPIRATORY:  Negative.  The patient denies productive cough,  hemoptysis, wheezing.  GASTROINTESTINAL:  Notable only for mild  intermittent constipation.  The patient has no difficulty swallowing.  He denies  hematochezia, hematemesis, melena.  MUSCULOSKELETAL:  Negative.  NEUROLOGIC:  Negative.  HEMATOLOGIC:  Notable in that the  patient states he has some type of bleeding diathesis and called himself  a free bleeder.  GENITOURINARY:  Negative.  PSYCHIATRIC:  Negative.   PAST MEDICAL HISTORY:  1. Coronary artery disease.  2. Hyperlipidemia.  3. Hypertension.  4. Chronic chelation therapy, most recent chelation treatment more      than 3 months ago.   PAST SURGICAL HISTORY:  1. Right inguinal hernia repair.  2. Right knee surgery x2.  3. Left hip surgery x1.  4. Lumbar laminectomy, diskectomy.  5. Repair of trauma left lower extremity in the distant past secondary      to a fall.   FAMILY HISTORY:  Noncontributory.   SOCIAL  HISTORY:  The patient is divorced and lives alone in [place].  He has 3 grown children and numerous grandchildren.  His children live  nearby and are supportive.  The patient has been disabled for years  secondary to coronary artery disease.  He previously worked as a Ecologist.  He denies tobacco use.  He denies alcohol use.   MEDICATIONS PRIOR TO ADMISSION:  1. Inderal 40 mg daily.  2. Aspirin 81 mg daily.  3. Vitamin C supplement daily.   DRUG ALLERGIES:  None known.   PHYSICAL EXAMINATION:  GENERAL:  The patient is a well-appearing male  who appears his stated age or even somewhat younger and in no acute  distress.  He is in normal sinus rhythm.  HEENT:  Unrevealing.  NECK:  Supple.  There is no cervical nor supraclavicular  lymphadenopathy.  There are bilateral carotid bruits.  There is no  palpable lymphadenopathy.  CHEST:  Auscultation of the chest reveals clear breath sounds, which are  symmetrical bilaterally.  No wheezes or rhonchi noted.  CARDIOVASCULAR:  Regular rate and rhythm.  No murmurs, rubs, or gallops  are appreciated.  ABDOMEN:  Soft, nondistended, nontender.  There are no palpable masses.  Bowel sounds are present.  EXTREMITIES:  Warm and well perfused.  There is no lower extremity  edema.  There are changes of chronic varicose veins in the left lower  leg.  The right lower leg does not appear to have significant  varicosity.  There is no sign of any skin changes, and the skin is  healthy appearing throughout.  Distal pulses are not palpable in either  lower leg at the ankle.  RECTAL AND GU:  Both deferred.  NEUROLOGIC:  Grossly nonfocal and symmetrical throughout.   DIAGNOSTIC TEST:  Cardiac catheterization performed by Dr. Riley Kill is  reviewed.  This demonstrates heavily calcified proximal coronary  arteries.  There is 50% and 70% stenosis of the proximal and mid left  anterior descending coronary artery beginning just after takeoff of the  first  diagonal branch.  There is 95% proximal stenosis of the first  diagonal branch.  This diagonal branch also bifurcates and there is 100%  occlusion of the medial subbranch of this vessel.  There is an 80%  ostial stenosis of the left circumflex coronary artery extending into  95% proximal stenosis.  There is a dominant large circumflex marginal  branch.  There is left-to-right collateral filling of the terminal  branches of the right coronary system.  There is 70% proximal stenosis  of the right coronary artery with 100% occlusion of the midportion of  this vessel.  There is mild-to-moderate left ventricular dysfunction  with severe  inferior wall hypokinesis.  Ejection fraction is estimated  40%.  There may be mild mitral regurgitation.  Left ventricular end-  diastolic pressure is 12.   IMPRESSION:  Severe 3-vessel coronary artery disease status post acute  non-ST-segment elevation myocardial infarction.  I believe that Mr.  Guevarra would best be treated with surgical revascularization.  He  received a total of 675 mg of Plavix over the last 48 hours subsequent  to his admission initially at [facility] in [place].  He remains  entirely stable clinically since presentation with no recurrent episodes  of chest pain.   PLAN:  I have discussed options at length with Mr. List and his  family.  Alternative treatment strategies have been discussed.  They  understand and accept all potentials, associated risks of surgery  including but not limited to risk of death, stroke, myocardial  infarction, congestive heart failure, respiratory failure, pneumonia,  bleeding requiring blood transfusion, arrhythmia, heart block with  bradycardia requiring permanent pacemaker, recurrent coronary artery  disease.  They understand that because of Plavix administration it might  be prudent to wait at least 5-7 days to allow the effects of Plavix to  dissipated from his system as long as that he remains  clinically stable.      Salvatore Decent. Cornelius Moras, M.D.  Electronically Signed     CHO/MEDQ  D:  06/11/2008  T:  06/12/2008  Job:  [id number]   cc:   Arturo Morton. Riley Kill, MD, [facility]  Luis Abed, MD, [facility]  Doreen Beam, MD

## 2010-09-08 NOTE — Assessment & Plan Note (Signed)
[facility]                          [facility] OFFICE NOTE   NAME:Stephen Olsen, Stephen Olsen                        MRN:          [id number]  DATE:07/09/2008                            DOB:          [birth year]    Mr. Hynes came from [place] to [facility] and required both CABG and  a pacemaker.  I will have to review the chart further to get all the  details.  He is doing well.  He is back here today with his son.  He saw  Dr. Laneta Simmers earlier today.  The patient does not hear well.  Going  forward, communication should be through the son, if a telephone  conversation is needed at [phone].   The patient underwent CABG and then required a pacemaker.  He is doing  well.   PAST MEDICAL HISTORY:   ALLERGIES:  No known drug allergies.   MEDICATIONS:  Crestor, metoprolol, and we will check again on aspirin.   OTHER MEDICAL PROBLEMS:  See the prior notes and review in the future.   REVIEW OF SYSTEMS:  He has no GI or GU symptoms.  He is not having any  significant problems.  He has no fevers, chills, or headaches.  He has  no GI or GU symptoms.   PHYSICAL EXAMINATION:  VITAL SIGNS:  Blood pressure is 110/60 with a  pulse of 70.  The patient is oriented to person, time, and place.  He is  hard of hearing.  He is here with his son.  HEENT:  No xanthelasma.  He has normal extraocular motion.  NECK:  There are no carotid bruits.  There is no jugular venous  distention.  LUNGS:  Clear.  Respiratory effort is not labored.  CARDIAC:  An S1 with an S2.  There are no clicks or significant murmurs.  His chest is nicely healed.  His pacemaker site looks excellent.  He has  no peripheral edema.   No labs were done today.   PROBLEMS:  1. Coronary artery disease, post coronary artery bypass graft.  2. A permanent pacemaker placed recently.     Luis Abed, MD, [facility]  Electronically Signed    JDK/MedQ  DD: 07/09/2008  DT: 07/10/2008  Job #: [id number]

## 2010-09-08 NOTE — H&P (Signed)
NAMEKELLAN, RAFFIELD                 ACCOUNT NO.:  [id number]   MEDICAL RECORD NO.:  [id number]          PATIENT TYPE:  INP   LOCATION:  2311                         FACILITY:  [facility]   PHYSICIAN:  Luis Abed, MD, FACCDATE OF BIRTH:  08/17/1924   DATE OF ADMISSION:  06/09/2008  DATE OF DISCHARGE:                              HISTORY & PHYSICAL   PRIMARY CARDIOLOGIST:  None.   PRIMARY CARE PHYSICIAN:  Doreen Beam, MD   CHIEF COMPLAINT:  Chest pain.   HISTORY OF PRESENT ILLNESS:  This is an 77 year old male with past  medical history of coronary artery disease with remote history of MI x2  who presents with crushing chest pain which started this morning at 7  a.m. while he was cooking breakfast.  Pain is described as a dull,  aching, pressure in the left chest which does not radiate and is not  associated with nausea, vomiting, diaphoresis, or shortness of breath.  Pain is made worse by exertion and has been relieved by nitroglycerin  and anticoagulants given by the [facility] Emergency Department.  Mr.  Stamour has had several previous episodes of this type of chest pain  earlier this week which were precipitated by activity and relieved by  rest.  Today, the pain did not go away with rest or with nitro x3, so he  presented to the [facility] Emergency Department.   PAST MEDICAL HISTORY:  1. Coronary artery disease with history of MI x2, approximately 30      years ago.  2. Hyperlipidemia, the patient reports this is currently diet      controlled.   HOME MEDICATIONS:  1. Inderal 40 mg once a day.  2. Aspirin 81 mg daily.  3. Vitamin C supplements daily.   SOCIAL HISTORY:  Mr. Laughter lives in [place] by himself.  He is a  retired Naval architect.  He does not smoke cigarettes or drink alcohol.  He uses no drugs.  No herbal medication.  He is an active gentleman.  He  loads firewood and walks daily.   REVIEW OF SYSTEMS:  CONSTITUTIONAL:  Negative for fevers, chills,  sweats, or  weight change.  HEENT:  Negative for headache or vision  change.  CARDIOPULMONARY:  Positive for chest pain, dyspnea on exertion,  no orthopnea, paroxysmal nocturnal dyspnea.  No edema.  No syncope.  NEURO/PSYCH:  Negative for weakness or numbness.  GI:  Negative for  nausea, vomiting, or change in bowel habits.   PHYSICAL EXAMINATION:  VITAL SIGNS:  Temperature 97.6, pulse 54,  respirations 12, blood pressure 133/73, oxygen saturation 99% on 2 L  nasal canula.  GENERAL:  No acute distress, resting comfortably in bed.  HEENT:  Head is normocephalic, atraumatic.  Left pupil shows post  surgical change.  Extraocular motion is intact.  Sclerae are clear.  Mucous membranes are dry.  Dentition is fair.  NECK:  Supple with no bruits.  No JVD.  Carotid pulses +2, equal, no  bruit.  CARDIOVASCULAR:  S1, S2.  No murmurs, rubs, or gallops.  LUNGS:  Clear to auscultation bilaterally.  SKIN:  No rash.  No lesion.  ABDOMEN:  Soft, nontender.  Normal bowel sounds.  EXTREMITIES:  No clubbing, cyanosis, or edema.  NEURO:  The patient is alert and oriented x4.  Cranial nerves are  grossly intact with the exception of cranial nerve VII.  The patient is  hard of hearing.  Strength 5/5 in all extremities.   RADIOLOGY:  Chest x-Jamyron is pending.   EKG:  Dynamic changes are seen in anterolateral lead.   LABORATORY DATA:  White blood cells 8.0, hemoglobin 14.9, hematocrit  41.2, platelets 192.  Sodium 138, potassium 4.3, chloride 104, bicarb  27, BUN 17, creatinine 0.7, glucose 113.  CK 166, CK-MB 12.5, troponin  1.21.   ASSESSMENT AND PLAN:  This is an 77 year old male with past medical  history of coronary artery disease.  1. Non-ST elevation myocardial infarction:  Positive troponin,      positive EKG changes, and history support myocardial infarction.      Mr. Rosiles has received heparin, Plavix, aspirin, and      nitroglycerin from the [facility] Emergency Department.  We will      continue on  heparin drip, aspirin.  We will add a beta-blocker, ACE      inhibitor.  Check fasting lipid panel.  Check TSH.  He will need a      cardiac catheterization which is planned for tomorrow for further      risk stratification and possible intervention.  We will obtain 2-D      echo to evaluate left ventricular function.  We will follow him      with serial cardiac enzymes and EKG.  He will be placed on      nitroglycerin drip titrated to relieve pain.  At this point, he is      in 2-3/10 pain and seems to be resting comfortably.      Elby Showers, MD  Electronically Signed      Luis Abed, MD, [facility] [place]  Electronically Signed    CW/MEDQ  D:  06/10/2008  T:  06/10/2008  Job:  [id number]

## 2010-09-08 NOTE — Op Note (Signed)
[name], Stephen Olsen                 ACCOUNT NO.:  [id number]   MEDICAL RECORD NO.:  [id number]          PATIENT TYPE:  INP   LOCATION:  2020                         FACILITY:  [facility]   PHYSICIAN:  Duke Salvia, MD, FACCDATE OF BIRTH:  1924/06/25   DATE OF PROCEDURE:  06/22/2008  DATE OF DISCHARGE:                               OPERATIVE REPORT   PREOPERATIVE DIAGNOSIS:  Atrial flutter.   POSTOPERATIVE DIAGNOSIS:  Sinus rhythm.   PROCEDURE:  Atrial overdrive pacing cardioversion.   The patient's pacemaker (St. Jude) was interrogated.  The atrial flutter  cycle length was noted to be 200 msec, 150 per cycle length at 20  seconds was successful in terminating atrial flutter and restoring sinus  rhythm.   The patient's pacemaker was programmed in the DDD mode.      Duke Salvia, MD, [facility]  Electronically Signed     SCK/MEDQ  D:  06/22/2008  T:  06/22/2008  Job:  [id number]

## 2010-09-08 NOTE — Op Note (Signed)
NAMEMANOLO, BOSKET                 ACCOUNT NO.:  [id number]   MEDICAL RECORD NO.:  [id number]          PATIENT TYPE:  INP   LOCATION:  2020                         FACILITY:  [facility]   PHYSICIAN:  Hillis Range, MD       DATE OF BIRTH:  [birth year]   DATE OF PROCEDURE:  DATE OF DISCHARGE:                               OPERATIVE REPORT   SURGEON:  Hillis Range, MD   PREPROCEDURE DIAGNOSIS:  Mobitz syndrome II second-degree  atrioventricular block.   POSTPROCEDURE DIAGNOSIS:  Mobitz syndrome II second-degree  atrioventricular block.   PROCEDURES:  1. Left upper extremity venogram.  2. Dual-chamber pacemaker implantation.   INTRODUCTION:  Mr. Bayless is a pleasant 77 year old gentleman with a  history of coronary artery disease who recently presented for coronary  artery bypass grafting.  Upon hospitalization prior to his bypass, he  was observed to have high-grade AV block with multiple P-waves are not  conducted.  Review of his EKG reveals Mobitz II second-degree AV block.  The patient reports symptoms of presyncope and dizziness.  Following  surgery he required temporary pacing.  He therefore presents for dual-  chamber pacemaker implantation.   DESCRIPTION OF PROCEDURE:  Informed written consent was obtained, and  the patient was brought to the electrophysiology lab in a fasting state.  He was adequately sedated with intravenous fentanyl as outlined in the  nursing report.  The patient's left chest was prepped and draped in the  usual sterile fashion by the EP lab staff.  The skin overlying the left  deltopectoral region was infiltrated with lidocaine for local analgesia.  A 5-cm incision was made over the left deltopectoral region.  A  subcutaneous pacemaker pocket was fashioned using a combination of sharp  and blunt dissection.  The electrocautery was used to assure hemostasis.  A venogram of the left upper extremity was performed, which revealed  patent left cephalic,  axillary, and subclavian veins.  With fluoroscopic  visualization, the left axillary vein was cannulated and through this  vein, a [employer] Medical Tendril SDX model [id number] (serial [id number]),  right atrial lead and a [employer] Medical Tendril SDX model [id number]  (serial [id number]), right ventricular lead were advanced with  fluoroscopic visualization in to the right atrial appendage and apical  right ventricular septal positions respectively.  Initial atrial lead P-  waves measured 4.1 millivolts with an impedance of 474 ohms and a  threshold of 1 volt at 0.5 milliseconds.  The ventricular lead R-wave  measured 29 millivolts (paced rhythm) with an impedance of 699 ohms and  a threshold of 0.6 volts at 0.5 milliseconds.  The leads were secured to  the pectoralis fascia using #2 silk suture over the suture sleeves.  The  leads were then connected to a [facility] model [id number]  [phone]) dual-chamber pacemaker.  The pacemaker was placed into  the subcutaneous pacemaker pocket.  Electrocautery was again used to  assure hemostasis.  The pocket was then irrigated with copious  gentamicin solution.  The pocket was then closed in 2 layers with  2-0  Vicryl suture for the subcutaneous and subcuticular layers.  Steri-  Strips and a sterile bandage were then applied.  There were no early  apparent complications.   CONCLUSIONS:  1. Mobitz II second-degree atrioventricular block.  2. Successful dual-chamber pacemaker implantation.  3. No early apparent complications.      Hillis Range, MD  Electronically Signed     JA/MEDQ  D:  06/21/2008  T:  06/21/2008  Job:  [id number]   cc:   Luis Abed, MD, [facility]  Evelene Croon, M.D.

## 2010-09-08 NOTE — Cardiovascular Report (Signed)
Stephen Olsen, Stephen Olsen                 ACCOUNT NO.:  [id number]   MEDICAL RECORD NO.:  [id number]           PATIENT TYPE:   LOCATION:                                 FACILITY:   PHYSICIAN:  Stephen Morton. Riley Kill, MD, FACCDATE OF BIRTH:  1924/08/29   DATE OF PROCEDURE:  05/31/2008  DATE OF DISCHARGE:                            CARDIAC CATHETERIZATION   INDICATIONS:  Stephen Olsen is an 77 year old gentleman who presents with  a non-ST-elevation MI.  He has had some recurrent pain in the hospital.  Because of this, he was brought to the catheterization laboratory.  His  troponins are slightly positive.  On arrival here, he was pain free.   PROCEDURES:  1. Left heart catheterization.  2. Selective coronary arteriography.  3. Selective left ventriculography.   DESCRIPTION OF THE PROCEDURE:  The patient was brought to the cath lab,  prepped and draped in the usual fashion.  We discussed the case with  him.  Previous consent had been obtained.  Through an anterior puncture,  the right femoral artery was entered and a 5-French sheath was placed.  We had some difficulty cannulating the left coronary, but we were  subsequently successful using a 5 curved left Judkins catheter.  In  addition, a Williams right coronary was used to engage the right  coronary artery.  Central aortic and left ventricular pressure were  measured with a pigtail, and ventriculography was performed in the RAO  projection at 24 mL of contrast at 12 mL per second.  Following a  pressure pullback, the pigtail catheter was removed.  I then spoke with  the patient and then went out to talk to his family.  Ideally,  revascularization surgery would be recommended.  The family thought that  he was a candidate, and in fact, the patient had been cutting wood in  the last week; therefore, the procedure was completed, and he was taken  to the holding area for sheath removal and surgical consultation.  A  surgical consultation was then  called to the surgical office.   HEMODYNAMIC DATA:  1. Central aortic pressure 113/64, mean 84.  2. Left ventricular pressure 106/12.  3. There was no gradient or pullback across the aortic valve.   ANGIOGRAPHIC DATA:  1. Ventriculography was done in the RAO projection.  There was severe      hypo to near akinesis of the inferior myocardial segment with an      ejection fraction that appeared to be in the range of about 40-45%.      There did not appear to be significant mitral regurgitation.  2. The left coronary artery demonstrates a left main without critical      obstruction.  3. The LAD narrows down and has about 50% focal narrowing just after      the takeoff of the diagonal.  Just distal to this is a 70% area of      focal stenosis at the septal perforator.  The distal vessel has      luminal irregularity but is suitable for grafting.  The large  diagonal branch also has about a 95% area of proximal stenosis with      a daylight lesion.  The distal vessel here also appears to be      suitable for grafting with some mild irregularity.  4. The circumflex is a fairly large-caliber vessel providing mainly 1      large marginal branch.  The circumflex has about an 80% area of      ostial stenosis with about 95% just distal to the takeoff of a      small marginal.  The first marginal has 90% narrowing but is a      small-caliber vessel.  The remaining portion of the circumflex has      about 50% narrowing and supplies a large marginal and suitable for      grafting.  5. The right coronary artery has about 70% diffuse disease in the      proximal mid segment, and then it is totally occluded with      extensive collaterals from left to right.  The PDA itself appears      to be large in caliber, graftable with possible graftability of the      posterolateral as well.   CONCLUSIONS:  1. Moderate reduction in left ventricular function with a fairly large      inferior wall motion  abnormality.  2. Total occlusion of the right coronary artery with extensive left-to-      right collateralization.  3. Critical disease of both the circumflex and diagonal branch with      moderately high-grade disease of the left anterior descending.   CONCLUSION:  The patient has significant left ventricular dysfunction,  LAD diagonal disease, as well as extensive RCA and circumflex disease.  Based on the above findings, revascularization surgery would be  preferred.  A surgical consultation will be obtained.  This has been  explained to the patient and family, and he is agreeable.      Stephen Morton. Riley Kill, MD, [facility]  Electronically Signed     TDS/MEDQ  D:  06/10/2008  T:  06/11/2008  Job:  [id number]   cc:   CV laboratory  Doreen Beam, MD  Marca Ancona, MD

## 2010-09-08 NOTE — Letter (Signed)
October 18, 2008    Luis Abed, MD, [facility]  [address], [address]  [place], [place] [address]   RE:  PRESTIN, MUNCH  MRN:  [id number]  /  DOB:  Sep 08, 1924   Dear Stephen Olsen   It was a pleasure to see Mr. Timothy today at your request.  As you  recall, he is a gentleman who underwent a bypass surgery by Dr. Laneta Simmers  in February 2010.  This followed a presentation with non-ST-elevation  MI.  He developed sinus node dysfunction, underwent pacing, post CABG.  This was done by Dr. Johney Frame.  He then developed atrial flutter which was  paced terminated.  It was elected at that time not to start him on  Coumadin for reasons that are not clear.  He then came back and saw you  in followup the beginning of May.  He was noted again to be an atrial  flutter.  The pacemaker was implanted for type 2 heart block.   Mr. Cruise is noted to have a CHADS score of 2 based on hypertension  and age, left ventricular function is moderately depressed at 40% which  might gave him a CHADS score of 3.   CURRENT MEDICATIONS:  Aspirin, metoprolol, and Crestor.  He is currently  not on Coumadin that decision having been deferred.   PHYSICAL EXAMINATION:  VITAL SIGNS:  Today, his blood pressure is 153/89  and his pulse was 71.  NECK VEINS:  Flat.  LUNGS:  Clear.  HEART:  Sounds were regular without murmurs or gallops.  ABDOMEN:  Soft with active bowel sounds.  EXTREMITIES:  Distal pulses were intact.  There is no clubbing,  cyanosis, or edema.  NEUROLOGICAL:  Grossly normal.  SKIN:  Warm and dry.   Interrogation of his St. Jude pulse generator demonstrates that he was  an atrial flutter confirmed by 12-lead electrocardiogram, as being  typical.  The flutter wave was 5, the impedance was 473, the R-wave was  11, impedance of 530, threshold 0.75 at 0.5.  Battery voltage 2.8.   IMPRESSION:  1. Atrial flutter - recurrent - typical.  2. Coronary artery disease.      a.     Status post coronary artery  bypass graft.      b.     Ejection fraction of 40-45%.  3. Thromboembolic risk factors.      a.     Notable for age.      b.     Hypertension.      c.     Borderline ejection fraction or CHADS score of 3 with some       concern because of his more advanced age and because of more       depressed left ventricular function.  4. Mobitz II heart block, status post pacemaker implantation, now 100%      ventricularly paced.   Stephen Olsen, Mr. Mcmannis has atrial flutter.  I would concur that Coumadin is  appropriate for thromboembolic risk reduction.  His atrial flutter,  however, is amenable to ablation; antecedent anticoagulation would be  appropriate with then anticipated ablation and subsequent  discontinuation of his Coumadin 3-4 weeks hence.   We will also have to keep an eye on his left ventricular function, given  that he is paced with somewhat depressed LV function already.   I have reviewed with the patient potential benefits and risks, he will  be begin on Coumadin today in the [facility].  We will anticipate seen  him once his Coumadin is therapeutic for catheter ablation.   Thank you for the consultation.    Sincerely,      Duke Salvia, MD, [facility]  Electronically Signed    SCK/MedQ  DD: 10/18/2008  DT: 10/19/2008  Job #: [id number]

## 2010-09-08 NOTE — Assessment & Plan Note (Signed)
[facility]                          [facility] NOTE   NAME:Stephen Olsen, Stephen Olsen                        MRN:          [id number]  DATE:08/28/2008                            DOB:          18-Sep-1924    REASON FOR VISIT:  Scheduled followup here.   Please refer to Dr. Myrtis Ser' recent post-hospital followup note of July 09, 2008, for complete details.   Stephen Olsen continues to do extraordinarily well since undergoing 5-  vessel coronary artery bypass grafting, by Dr. Evelene Croon, in February  2010.  This was following presentation with non-ST-elevation myocardial  infarction.  He states that he has not had any chest pain since his  bypass surgery.   A 12-lead electrocardiogram today indicates typical atrial flutter with  ventricular pacing at 70 bpm.  Clinically, Stephen Olsen denies any tachy  palpitations.  Review of his hospital records does indicate that he  developed atrial flutter following placement of a St. Jude dual-chamber  pacemaker, by Dr. Johney Frame, for treatment of Mobitz II atrioventricular  block.  The patient apparently underwent subsequent successful  cardioversion with utilization of atrial override, by Dr. Sherryl Manges.  Records indicate that he was maintaining NSR by time of discharge.   Stephen Olsen has a CHAD2 score of 2, based on hypertension and age.  He  denies any diagnosis of diabetes mellitus, history of congestive heart  failure, or stroke.   EKG today indicates ventricular pacing at 70 bpm with underlying atrial  flutter.   CURRENT MEDICATIONS:  1. Metoprolol tartrate 25 b.i.d.  2. Aspirin 81 daily.  3. Crestor 40 daily.   PHYSICAL EXAMINATION:  VITAL SIGNS:  Blood pressure 153/95, pulse 70,  regular, weight 183.  GENERAL:  An 77 year old male sitting upright in no distress.  HEENT:  Normocephalic, atraumatic.  NECK:  No JVD.  LUNGS:  Clear to auscultation in all fields.  HEART:  Regular rate and rhythm.  No  significant murmurs.  SKIN:  Well-healed mid sternal incision.  ABDOMEN:  Soft, nontender.  EXTREMITIES:  1+ bilateral nonpitting edema.  NEURO:  Alert and oriented.   IMPRESSION:  1. Multivessel coronary artery disease.      a.     Status post NSTEMI/5-vessel CABG, February 2010.      b.     EF 40-45%.  2. Recurrent atrial flutter.      a.     Subclinical.      b.     Status post initial successful atrial override       cardioversion, by Dr. Sherryl Manges, post permanent pacemaker       implantation.      c.     CHAD2 score:  2.  3. Second-degree atrioventricular block (Mobitz II).      a.     Status post St. Jude dual-chamber pacemaker implantation.  4. Hypertension.  5. Dyslipidemia.  6. Mild mitral regurgitation.   PLAN:  1. The patient is scheduled to establish with Dr. Sherryl Manges in our      [facility] on October 18, 2008.  We will defer to Dr. Graciela Husbands for      further recommendations with respect to management of this      recurrent atrial flutter.  The patient is asymptomatic with this,      but does have a borderline [place] score of 2, secondary to      hypertension and age.  He is currently on aspirin, which will be      continued, but may need to be considered for long-term Coumadin      anticoagulation.  We will await Dr. Odessa Fleming input and      recommendations regarding this, at the time of his scheduled      followup.  In the interim, we will increase his aspirin to 325 mg      and continue the current dose of Lopressor for rate control.  2. Decrease Crestor from 40 to 20 mg daily.  The patient complains of      muscle and joint pain on this high dose of Crestor.  His recent      lipid profile indicated an LDL of 140.  We will reassess this with      a followup fasting lipid/liver profile in 12 weeks, with      recommended target LDL of 70 or less, if feasible.  3. I initially recommended addition of low-dose lisinopril 5 mg daily      for treatment of  uncontrolled hypertension.  Stephen Olsen, however,      cited a prior intolerance to an antihypertensive approximately 2      years ago, but does not recall the particular medication.  He took      himself off this medication after only a few days.  We, therefore,      agreed to monitor this in the ambulatory setting, and for him to      bring some of these readings with him at time of his next scheduled      visit.  He has agreed to consider the addition of an      antihypertensive, if he demonstrates persistently elevated blood      pressure readings.  In this regard, I would      recommend an ACE inhibitor, given his noted mild LV dysfunction.  4. Schedule return clinic followup with myself and Dr. Myrtis Ser in 3      months.      Rozell Searing, PA-C  Electronically Signed      Luis Abed, MD, [facility]  Electronically Signed   GS/MedQ  DD: 08/28/2008  DT: 08/29/2008  Job #: [id number]   cc:   Doreen Beam, MD

## 2010-09-11 NOTE — Discharge Summary (Signed)
NAME:  Stephen Olsen, Stephen Olsen                 ACCOUNT NO.:  [id number]   MEDICAL RECORD NO.:  [id number]          PATIENT TYPE:  INP   LOCATION:  2020                         FACILITY:  [facility]   PHYSICIAN:  Evelene Croon, M.D.     DATE OF BIRTH:  Sep 24, 1924   DATE OF ADMISSION:  06/09/2008  DATE OF DISCHARGE:  06/23/2008                               DISCHARGE SUMMARY   HISTORY:  The patient is an 77 year old male with a past medical history  of coronary artery disease with remote history of myocardial infarction  x2, who presented with crushing chest pain that started on the morning  of admission at 7:00 a.m. while he was cooking breakfast.  The pain was  described as dull aching pressure in the left chest which did not  radiate and was not associated with nausea, vomiting, diaphoresis, or  shortness of breath.  Pain was noted to be made worse by exertion and  was relieved with nitroglycerin and anticoagulants when arrived at  [facility] Emergency Department.  The patient stated he had several  previous episodes of this type of chest pain earlier in the week that  were precipitated by activity and relieved with rest.  On the date of  admission, the symptoms did not dissipate with rest or with nitro and he  therefore presented to [facility] Emergency Department.  Findings were  consistent with a non-ST-segment elevation myocardial infarction with  positive troponins and EKG changes supporting this.  He was given  heparin, Plavix, aspirin, and nitroglycerin in the [facility] Emergency  Department and he was transferred to [facility] for further management.   PAST MEDICAL HISTORY:  1. Coronary artery disease with myocardial infarction x2 approximately      30 years ago.  2. Hyperlipidemia.  The patient reported as diet controlled.   MEDICATIONS PRIOR TO ADMISSION:  1. Inderal 40 mg once a day.  2. Aspirin 81 mg daily.  3. Vitamin C supplements daily.   FAMILY HISTORY/SOCIAL HISTORY/REVIEW OF  SYMPTOMS/PHYSICAL EXAMINATION:  Please see the history and physical done at the time of admission.   HOSPITAL COURSE:  The patient was admitted.  He was continued on  heparin.  He was continued on nitroglycerin.  He was scheduled for  cardiac catheterization which he underwent on June 10, 2008 by Dr.  Riley Kill.  The findings were consistent with severe three-vessel coronary  artery disease with moderate left ventricular dysfunction.  Ejection  fraction was estimated at 40-45%.  The Plavix was discontinued and a  surgical consultation was obtained.  The surgeons agree with  recommendations to proceed with surgical revascularization.  He was  stabilized medically and Plavix was given at a time period for washout.  He was deemed to be acceptable for proceeding with the surgical  intervention on June 17, 2008.  The following procedure was  performed.  1. Coronary artery bypass grafting x5 and the following grafts were      placed.      a.     Left internal mammary artery to the LAD.      b.  A sequential saphenous vein graft to diagonal #1 and #2.      c.     Saphenous vein graft to the posterior descending.      d.     Saphenous vein graft to the obtuse marginal.   Notable findings during the procedure was at the posterolateral RCA.  It  was small and diffusely diseased and not graftable.   POSTOPERATIVE HOSPITAL COURSE:  The patient overall progressed nicely.  Primary difficulty during the postoperative period was heart block,  initially third-degree.  A electrophysiology consultation was obtained.  He was monitored over several days and it was ultimately agreed that  placement of a permanent pacemaker should be undertaken.  This was done  on June 21, 2008 by Dr. Johney Frame.  The procedure at that time was felt  to be required because of the Mobitz II AV block rhythm.  Following this  procedure, the patient was continued with cardiac rehabilitation.  He  did have post  procedure atrial flutter and underwent atrial override  cardioversion on June 22, 2008 by Dr. Graciela Husbands.  This was successful.  At time of discharge, he was maintaining sinus rhythm.  The patient  otherwise had all routine lines, monitors, and drainage devices  discontinued in the standard fashion.  Oxygen was weaned without  difficulty.  He tolerated gradual increase in activity using standard  protocols.  He did have an acute blood loss anemia.  This was stable.  Most recent hemoglobin and hematocrit dated June 19, 2008 were 11.1  and 32.3 respectively.  He was overall deemed to be acceptable for  discharge on June 23, 2008.   DISCHARGE MEDICATIONS:  1. Aspirin 325 mg daily.  2. Vitamin C 500 mg daily.  3. Crestor 40 mg daily.  4. Oxycodone 5 mg 1-2 every 6 hours p.r.n. pain.  5. Lasix 40 mg daily for 5 days.  6. Potassium chloride 20 mEq daily for 5 days.  7. Lopressor 25 mg twice daily.   INSTRUCTIONS:  The patient received written instructions regarding  medications, activity, diet, wound care, and followup.  Followup include  Dr. Myrtis Ser 2 weeks post discharge and Dr. Laneta Simmers on July 09, 2008. He has  also had instructions to follow up with Dr. Johney Frame in the [facility].   CONDITION ON DISCHARGE:  Stable, improved.   FINAL DIAGNOSES:  1. Acute myocardial infarction status post surgical revascularization      as described.  2. Postoperative third-degree heart block.  3. Postoperative Mobitz type 2 atrioventricular block.  4. Postoperative atrial flutter.  5. Postoperative permanent pacemaker placement.  6. Hypertension.  7. Hyperlipidemia.  8. History of remote myocardial infarction x2 approximately 30 years      ago.  9. Postoperative volume overload.      Rowe Clack, P.A.-C.      Evelene Croon, M.D.  Electronically Signed    WEG/MEDQ  D:  07/30/2008  T:  07/30/2008  Job:  [id number]   cc:   Duke Salvia, MD, [facility]  Luis Abed, MD, [facility]

## 2010-09-11 NOTE — Op Note (Signed)
[place]. [facility]  Patient:    DAYMEIN, NUNNERY Visit Number: [id number] MRN: [id number]          Service Type: DSU Location: [facility] Attending Physician:  Tempie Donning Dictated by:   Gita Kudo, M.D. Proc. Date: 02/28/01 Admit Date:  02/28/2001   CC:         Dr. Pia Mau. Baker in [place], [place]   Operative Report  PREOPERATIVE DIAGNOSIS:  Right inguinal hernia.  POSTOPERATIVE DIAGNOSIS:  Right inguinal hernia, large Pantaloon, direct.  OPERATIVE PROCEDURE:  Repair of right inguinal hernia - large direct - with Prolene mesh, preperitoneal and onlay.  SURGEON:  Gita Kudo, M.D.  ANESTHESIA:  General.  CLINICAL SUMMARY:  A 77 year old male admitted for elective surgery.  He has a bulge in his right groin and physical examination confirms a hernia.  OPERATIVE FINDINGS:  The patient had no indirect sac noted.  He had a large direct Pantaloon hernia.  The cord and its contents and nerves are identified and not injured.  DESCRIPTION OF PROCEDURE:  Under satisfactory general anesthesia, having received 1.0 g of Ancef preoperative, the patients abdomen and genitalia are prepped and draped in a standard fashion.  A transverse right lower quadrant incision made and carried to and through external ring and external oblique. Bleeders coagulated or tied with 3-0 Vicryl.  Cord and its contents mobilized with a Penrose drain and the self-retaining retractor set for good exposure. The cord dissected and no indirect sac identified.  The large direct hernia was noted.  The floor of the canal was opened from the pubis to the internal ring.  The epigastric vessels were dissected and isolated away.  Finger dissection used to develop a preperitoneal space and reduce the hernial contents.  They were held away with a moistened gauze while one-third of a 3 x 6 inch piece of Prolene mesh was tailored into an oval and anchored to Coopers ligament in the  preperitoneal space.  The moistened gauze was removed and the mesh unfolded, and the floor closed over it with running 0 Prolene, taking intermittent bites of the mesh.  The mesh was placed over the preperitoneal content and deep to the epigastric vessels, and extended over to the femoral vessels.  The suture when tied made the external ring snug and the ends were left long.  A second suture was placed lateral to the vessels to tighten the ring laterally.  Then, the remainder of the mesh was tailored into an oval with a slit to go around the cord structure.  It was anchored at the internal ring with a previous suture, and then tacked around the periphery under slight tension to the inguinal ligament below and the internal oblique above.  The tails were sutured to each other and the fascia above, around and lateral to the cord.  The wound was then infiltrated with Marcaine, lavaged with saline, and closed in layers with running 2-0 Vicryl for external oblique, 2-0 Vicryl for deep fascia, 3-0 Vicryl for subcutaneous, and Steri-Strips for skin.  A sterile absorbent dressing was applied.  Sponge and needle count was correct.  The patient tolerated the procedure well and went to the recovery room in good condition. Dictated by:   Gita Kudo, M.D. Attending Physician:  Tempie Donning DD:  02/28/01 TD:  03/01/01 Job: [id number]

## 2010-09-29 ENCOUNTER — Ambulatory Visit (INDEPENDENT_AMBULATORY_CARE_PROVIDER_SITE_OTHER): Payer: Medicare Other | Admitting: *Deleted

## 2010-09-29 DIAGNOSIS — Z7901 Long term (current) use of anticoagulants: Secondary | ICD-10-CM

## 2010-09-29 DIAGNOSIS — I4892 Unspecified atrial flutter: Secondary | ICD-10-CM

## 2010-09-29 DIAGNOSIS — I4891 Unspecified atrial fibrillation: Secondary | ICD-10-CM

## 2010-10-27 ENCOUNTER — Ambulatory Visit (INDEPENDENT_AMBULATORY_CARE_PROVIDER_SITE_OTHER): Payer: Medicare Other | Admitting: *Deleted

## 2010-10-27 DIAGNOSIS — I4891 Unspecified atrial fibrillation: Secondary | ICD-10-CM

## 2010-10-27 DIAGNOSIS — Z7901 Long term (current) use of anticoagulants: Secondary | ICD-10-CM

## 2010-10-27 DIAGNOSIS — I4892 Unspecified atrial flutter: Secondary | ICD-10-CM

## 2010-10-27 LAB — POCT INR: INR: 2.4

## 2010-10-27 NOTE — Patient Instructions (Addendum)
Pt came in and had altercation with front desk staff stating he did not want them to make him follow up appts.Stephen Olsen defensive with staff over his insurance and what he is having to pay.  Encouraged pt to call insurance company but he became more defensive.  I discussed above issues with pt and explained the importance of checking coumadin on a regular basis and he cannot just have it checked when he wants to.  Told pt we would not be able to continue to manage his coumadin under these conditions.  He has been non-compliant in the past and has stopped coumadin on his own several times before.  Pt wants to stop coumadin.  Advised against this but this is his choice. Advised pt to take ASA 325mg  daily which he states he is already taking.  Pt discharged from coumadin clinic and Dr Johney Frame advised.

## 2010-12-25 ENCOUNTER — Encounter: Payer: Self-pay | Admitting: *Deleted

## 2011-01-06 ENCOUNTER — Telehealth: Payer: Self-pay | Admitting: Cardiology

## 2011-01-06 NOTE — Telephone Encounter (Signed)
Pt daughter calling wanting to know if pt can start sending in remote device checks. Please return call to discuss further.

## 2011-01-06 NOTE — Telephone Encounter (Signed)
Spoke with daughter. Let her know that pt is past  Due for appt.  with Dr. Johney Frame  Also a mednet box will be sent to her home for her father

## 2011-01-12 IMAGING — CR DG CHEST 2V
2 series · 2 of 2 positions shown · non-contrast
Comparison: 06/19/2008

CLINICAL DATA: Post pacemaker.

CHEST - 2 VIEW

[w chest pa]
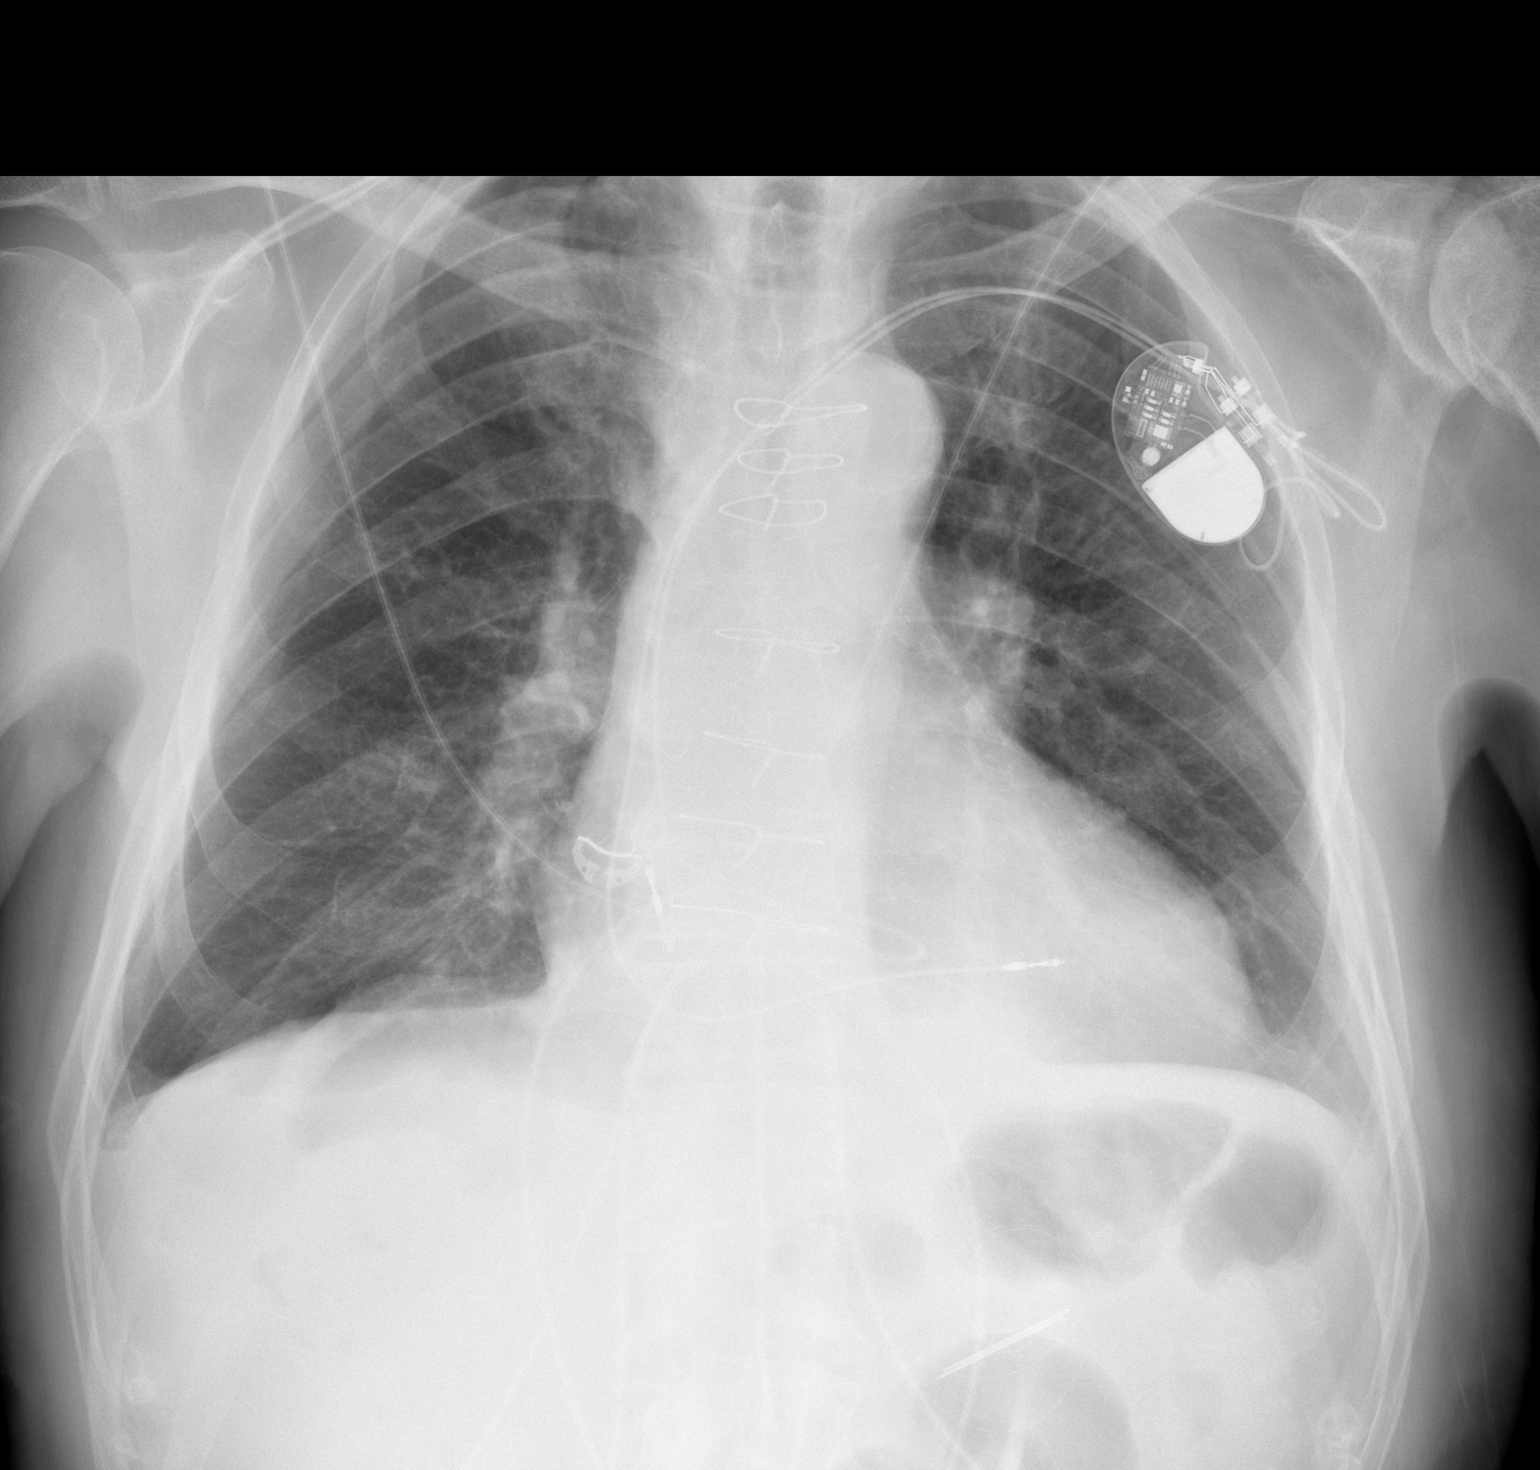

[w chest lat]
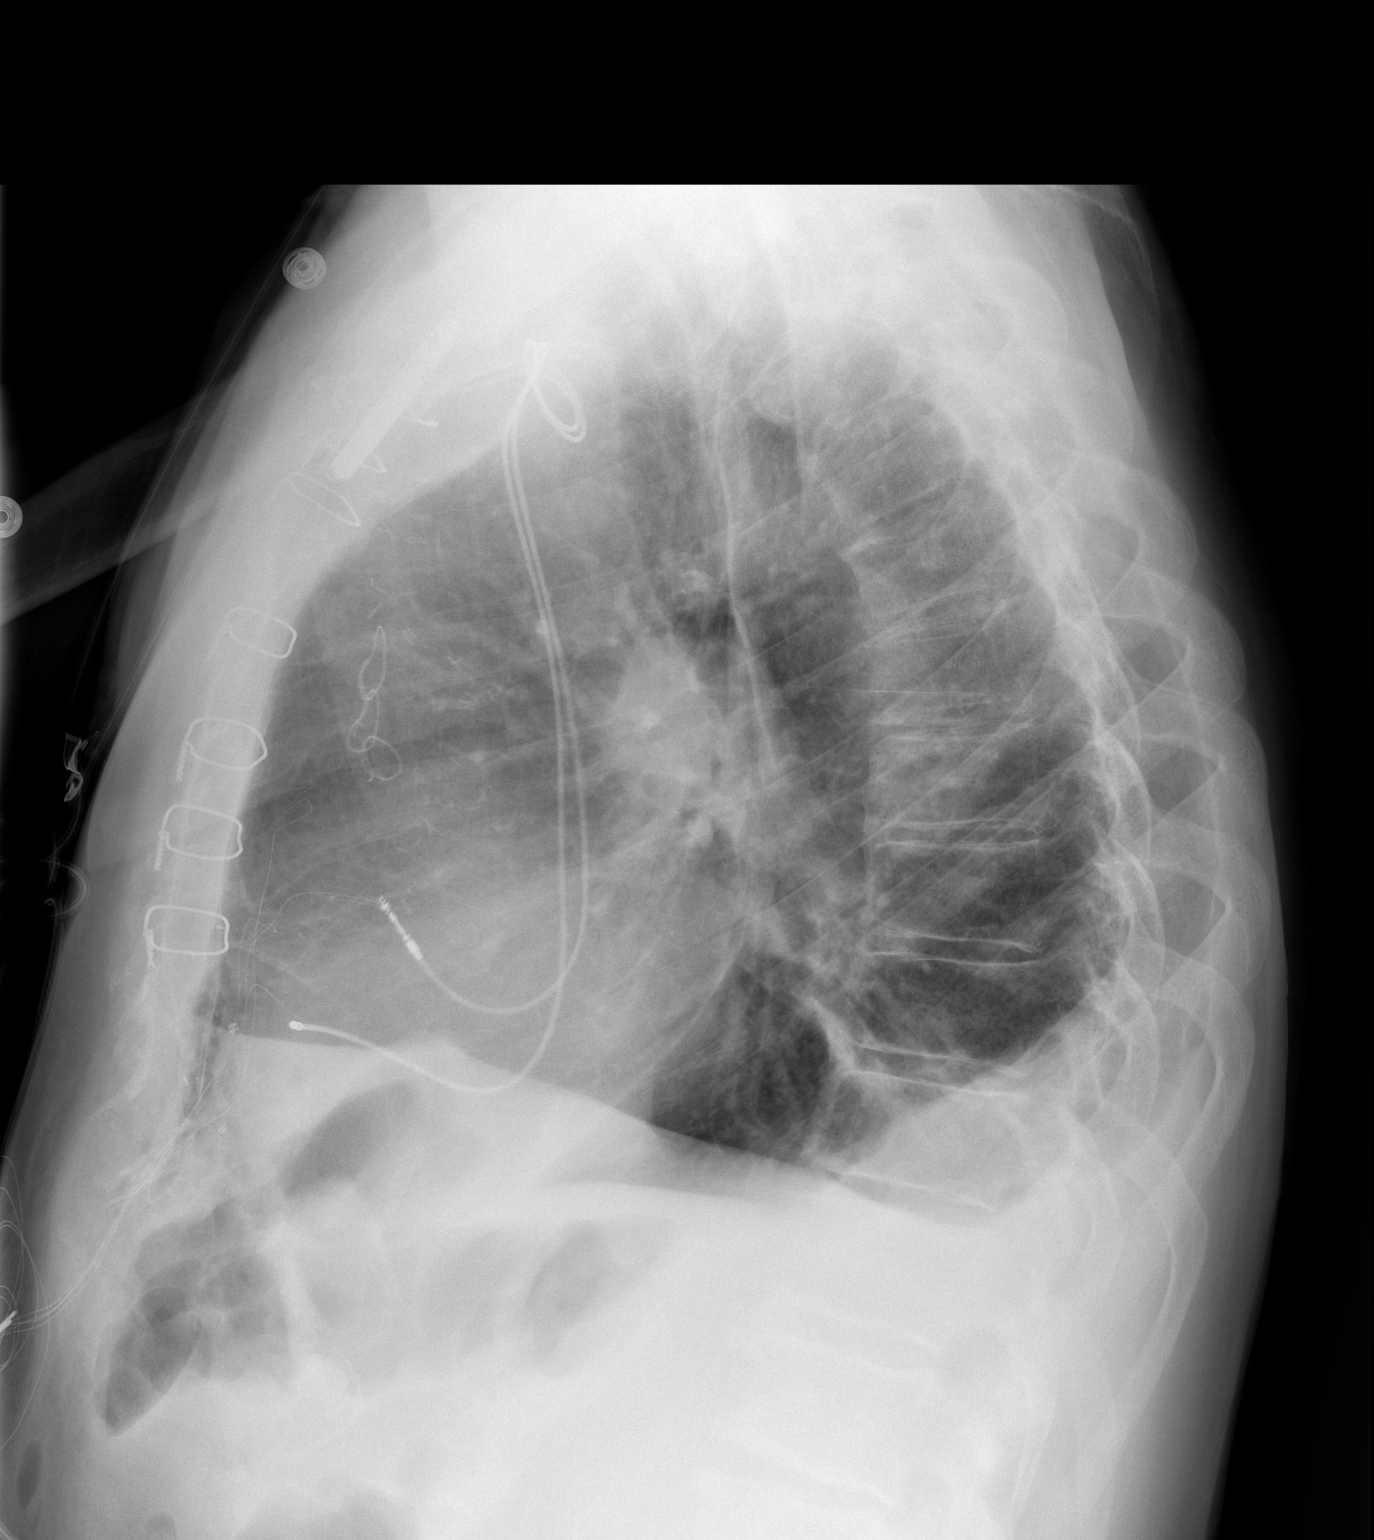

[2 of 2 positions shown; findings below may reference images not displayed]

FINDINGS: Small bilateral pleural effusions.  No pneumothorax.
Status post insertion of right atrial and right ventricular pacer
leads.
IMPRESSION: Status post pacer insertion.  Radiographically the leads appear in
satisfactory position.  Small pleural effusions.  No pneumothorax.

## 2011-01-13 LAB — PREPARE PLATELET PHERESIS

## 2011-01-20 ENCOUNTER — Encounter: Payer: Self-pay | Admitting: *Deleted

## 2011-01-21 ENCOUNTER — Ambulatory Visit (INDEPENDENT_AMBULATORY_CARE_PROVIDER_SITE_OTHER): Payer: Medicare Other | Admitting: Internal Medicine

## 2011-01-21 ENCOUNTER — Encounter: Payer: Self-pay | Admitting: Internal Medicine

## 2011-01-21 DIAGNOSIS — I2510 Atherosclerotic heart disease of native coronary artery without angina pectoris: Secondary | ICD-10-CM

## 2011-01-21 DIAGNOSIS — I4891 Unspecified atrial fibrillation: Secondary | ICD-10-CM

## 2011-01-21 DIAGNOSIS — I10 Essential (primary) hypertension: Secondary | ICD-10-CM

## 2011-01-21 DIAGNOSIS — I441 Atrioventricular block, second degree: Secondary | ICD-10-CM

## 2011-01-21 LAB — PACEMAKER DEVICE OBSERVATION
AL IMPEDENCE PM: 374 Ohm
AL THRESHOLD: 0.5 V
BAMS-0003: 70 {beats}/min
BATTERY VOLTAGE: 2.81 V
RV LEAD AMPLITUDE: 12 mv
RV LEAD IMPEDENCE PM: 507 Ohm

## 2011-01-21 NOTE — Progress Notes (Signed)
The patient presents today for routine electrophysiology followup.  Since last being seen in our clinic, the patient reports doing very well. He remains very active despite his age.  Today, he denies symptoms of palpitations, chest pain, shortness of breath, orthopnea, PND, dizziness, presyncope, syncope, or neurologic sequela. He reports labile INRs and states that he does not wish to take coumadin. The patient is otherwise without complaint today.   Past Medical History  Diagnosis Date  . Mitral regurgitation     mild...echo.Stephen KitchenMarland KitchenFebruary 2010  . Hyperlipemia   . Hypertension   . Atrioventricular block, type II     s/p PPM  . Atrial flutter     s/p CTI ablation October 2010 by Dr Graciela Husbands  . CAD (coronary artery disease)     s/p CABG  . Ejection fraction < 50%     45%..echo.Stephen KitchenMarland KitchenFebruary,2010/ EF 60%....TEE.Stephen KitchenMarland KitchenOctober,2010  . PFO (patent foramen ovale)     small....TEE....October,2010  . Anticoagulant long-term use     coumadin therapy(in frequent mode switches February,2012)   Past Surgical History  Procedure Date  . Cardiac pacemaker placement 06/21/2008    St. Jude dual-chamber permanent pacemaker  . Coronary artery bypass graft 06/17/2008    5 vessel  . Inguinal hernia repair     RIGHT  . Left lower extremity repair     from trauma in the distant past secondary to a fall  . Back surgery   . Total hip arthroplasty   . Total knee arthroplasty   . Atrial ablation surgery 2010    CTI ablation for atrial flutter by Dr Graciela Husbands 2010    Current Outpatient Prescriptions  Medication Sig Dispense Refill  . aspirin 325 MG tablet Take 325 mg by mouth daily.        Stephen Olsen docusate sodium (COLACE) 100 MG capsule Take 100 mg by mouth 2 (two) times daily.        . propranolol (INDERAL) 40 MG tablet Take 40 mg by mouth daily.          Allergies  Allergen Reactions  . Rosuvastatin     REACTION: blood pressure    History   Social History  . Marital Status: Divorced    Spouse Name: N/A   Number of Children: N/A  . Years of Education: N/A   Occupational History  . DISABLED/RETIRED    Social History Main Topics  . Smoking status: Never Smoker   . Smokeless tobacco: Never Used  . Alcohol Use: No  . Drug Use: No  . Sexually Active: Not on file   Other Topics Concern  . Not on file   Social History Narrative   Regular exercise--yes    Physical Exam: Filed Vitals:   01/21/11 1110  BP: 164/89  Pulse: 82  Resp: 18  Height: 5\' 11"  (1.803 m)  Weight: 188 lb (85.276 kg)    GEN- The patient is well appearing, alert and oriented x 3 today.   Head- normocephalic, atraumatic Eyes-  Sclera clear, conjunctiva pink Ears- hearing intact Oropharynx- clear Neck- supple, no JVP Lymph- no cervical lymphadenopathy Lungs- Clear to ausculation bilaterally, normal work of breathing Chest- pacemaker pocket is well healed Heart- Regular rate and rhythm, no murmurs, rubs or gallops, PMI not laterally displaced GI- soft, NT, ND, + BS Extremities- no clubbing, cyanosis, or edema MS- no significant deformity or atrophy Skin- no rash or lesion  Pacemaker interrogation- reviewed in detail today,  See PACEART report  Assessment and Plan:

## 2011-01-21 NOTE — Assessment & Plan Note (Signed)
Above goal today, though he reports good BP control at home. He declines medication adjustment today.

## 2011-01-21 NOTE — Assessment & Plan Note (Signed)
Maintaining sinus rhythm Since last year, only 11 episodes of afib, longest was 2 min 56 sec  Given his difficulty with coumadin as very low afib burden, pt is considering stopping coumadin.  I would recommend coumadin long term however if he wishes to stop coumadin, he should at least take ASA daily.

## 2011-01-21 NOTE — Assessment & Plan Note (Signed)
Stable No change required today  

## 2011-01-21 NOTE — Assessment & Plan Note (Signed)
Normal pacemaker function See Pace Art report No changes today  

## 2011-04-15 ENCOUNTER — Encounter: Payer: Self-pay | Admitting: Internal Medicine

## 2011-04-15 DIAGNOSIS — I495 Sick sinus syndrome: Secondary | ICD-10-CM

## 2011-07-15 ENCOUNTER — Encounter: Payer: Self-pay | Admitting: Internal Medicine

## 2011-07-15 DIAGNOSIS — I495 Sick sinus syndrome: Secondary | ICD-10-CM

## 2011-10-14 DIAGNOSIS — I495 Sick sinus syndrome: Secondary | ICD-10-CM

## 2012-01-13 DIAGNOSIS — I495 Sick sinus syndrome: Secondary | ICD-10-CM

## 2012-04-06 ENCOUNTER — Encounter: Payer: Self-pay | Admitting: *Deleted

## 2012-04-06 DIAGNOSIS — Z950 Presence of cardiac pacemaker: Secondary | ICD-10-CM | POA: Insufficient documentation

## 2012-04-12 ENCOUNTER — Encounter: Payer: Self-pay | Admitting: Internal Medicine

## 2012-04-12 ENCOUNTER — Ambulatory Visit (INDEPENDENT_AMBULATORY_CARE_PROVIDER_SITE_OTHER): Payer: Medicare Other | Admitting: Internal Medicine

## 2012-04-12 VITALS — BP 148/82 | HR 81 | Ht 70.5 in | Wt 189.0 lb

## 2012-04-12 DIAGNOSIS — I10 Essential (primary) hypertension: Secondary | ICD-10-CM

## 2012-04-12 DIAGNOSIS — I4892 Unspecified atrial flutter: Secondary | ICD-10-CM

## 2012-04-12 DIAGNOSIS — I2510 Atherosclerotic heart disease of native coronary artery without angina pectoris: Secondary | ICD-10-CM

## 2012-04-12 DIAGNOSIS — I4891 Unspecified atrial fibrillation: Secondary | ICD-10-CM

## 2012-04-12 DIAGNOSIS — Z950 Presence of cardiac pacemaker: Secondary | ICD-10-CM

## 2012-04-12 DIAGNOSIS — I441 Atrioventricular block, second degree: Secondary | ICD-10-CM

## 2012-04-12 LAB — PACEMAKER DEVICE OBSERVATION
AL THRESHOLD: 0.75 V
ATRIAL PACING PM: 97
BAMS-0001: 150 {beats}/min
BAMS-0003: 70 {beats}/min
BATTERY VOLTAGE: 2.8 V
RV LEAD AMPLITUDE: 12 mv

## 2012-04-12 NOTE — Progress Notes (Signed)
PCP: Ignatius Specking., MD  Stephen Olsen is a 78 y.o. male who presents today for routine electrophysiology followup.  Since last being seen in our clinic, the patient reports doing very well.  Today, he denies symptoms of palpitations, chest pain, shortness of breath,  lower extremity edema, dizziness, presyncope, or syncope.  He has had URI symptoms for 2-3 days with nasal congestion and dry cough.  He denies fevers or chills.  He thinks that his symptoms are improving.   The patient is otherwise without complaint today.   Past Medical History  Diagnosis Date  . Mitral regurgitation     mild...echo.Marland KitchenMarland KitchenFebruary 2010  . Hyperlipemia   . Hypertension   . Atrioventricular block, type II     s/p PPM  . Atrial flutter     s/p CTI ablation October 2010 by Dr Graciela Husbands  . CAD (coronary artery disease)     s/p CABG  . Ejection fraction < 50%     45%..echo.Marland KitchenMarland KitchenFebruary,2010/ EF 60%....TEE.Marland KitchenMarland KitchenOctober,2010  . PFO (patent foramen ovale)     small....TEE....October,2010  . Anticoagulant long-term use     coumadin therapy(in frequent mode switches February,2012)   Past Surgical History  Procedure Date  . Cardiac pacemaker placement 06/21/2008    St. Jude dual-chamber permanent pacemaker  . Coronary artery bypass graft 06/17/2008    5 vessel  . Inguinal hernia repair     RIGHT  . Left lower extremity repair     from trauma in the distant past secondary to a fall  . Back surgery   . Total hip arthroplasty   . Total knee arthroplasty   . Atrial ablation surgery 2010    CTI ablation for atrial flutter by Dr Graciela Husbands 2010    Current Outpatient Prescriptions  Medication Sig Dispense Refill  . aspirin 325 MG tablet Take 325 mg by mouth daily.        [name] docusate sodium (COLACE) 100 MG capsule Take 100 mg by mouth 2 (two) times daily.        . propranolol (INDERAL) 40 MG tablet Take 40 mg by mouth daily.          Physical Exam: Filed Vitals:   04/12/12 0853  BP: 148/82  Pulse: 81  Height: 5' 10.5"  (1.791 m)  Weight: 189 lb (85.73 kg)    GEN- The patient is well appearing, alert and oriented x 3 today.   Head- normocephalic, atraumatic Eyes-  Sclera clear, conjunctiva pink Ears- hearing intact Oropharynx- clear Lungs- Clear to ausculation bilaterally, normal work of breathing Chest- pacemaker pocket is well healed Heart- Regular rate and rhythm, no murmurs, rubs or gallops, PMI not laterally displaced GI- soft, NT, ND, + BS Extremities- no clubbing, cyanosis, or edema  Pacemaker interrogation- reviewed in detail today,  See PACEART report  Assessment and Plan:  ATRIOVENTRICULAR BLOCK, MOBITZ TYPE II   Normal pacemaker function  See Pace Art report  No changes today   ATRIAL FIBRILLATION  Maintaining sinus rhythm  Longest afib episodes is 2 minutes He is very clear in his decision to avoid anticoagulation  CAD, NATIVE VESSEL  Stable  No change required today   HYPERTENSION, UNSPECIFIED  Above goal today, though he reports good BP control at home.  He declines medication adjustment today.  Acute viral upper respiratory tract infection Appears stable Thinks symptoms are improving Supportive care recommended No indication for antibiotics at this time

## 2012-04-12 NOTE — Patient Instructions (Signed)
Continue all current medications. Remote check 3 months 1 year - Dr. Johney Frame

## 2012-07-13 DIAGNOSIS — I495 Sick sinus syndrome: Secondary | ICD-10-CM

## 2012-10-12 DIAGNOSIS — I495 Sick sinus syndrome: Secondary | ICD-10-CM

## 2013-01-18 ENCOUNTER — Encounter: Payer: Self-pay | Admitting: Internal Medicine

## 2013-01-18 DIAGNOSIS — I4891 Unspecified atrial fibrillation: Secondary | ICD-10-CM

## 2013-04-08 ENCOUNTER — Encounter: Payer: Self-pay | Admitting: Cardiology

## 2013-04-08 DIAGNOSIS — I441 Atrioventricular block, second degree: Secondary | ICD-10-CM | POA: Insufficient documentation

## 2013-04-08 DIAGNOSIS — E785 Hyperlipidemia, unspecified: Secondary | ICD-10-CM | POA: Insufficient documentation

## 2013-04-08 DIAGNOSIS — R9430 Abnormal result of cardiovascular function study, unspecified: Secondary | ICD-10-CM | POA: Insufficient documentation

## 2013-04-08 DIAGNOSIS — I4892 Unspecified atrial flutter: Secondary | ICD-10-CM | POA: Insufficient documentation

## 2013-04-08 DIAGNOSIS — Z951 Presence of aortocoronary bypass graft: Secondary | ICD-10-CM | POA: Insufficient documentation

## 2013-04-08 DIAGNOSIS — I340 Nonrheumatic mitral (valve) insufficiency: Secondary | ICD-10-CM | POA: Insufficient documentation

## 2013-04-08 DIAGNOSIS — Q211 Atrial septal defect: Secondary | ICD-10-CM | POA: Insufficient documentation

## 2013-04-08 DIAGNOSIS — I10 Essential (primary) hypertension: Secondary | ICD-10-CM | POA: Insufficient documentation

## 2013-04-10 ENCOUNTER — Ambulatory Visit (INDEPENDENT_AMBULATORY_CARE_PROVIDER_SITE_OTHER): Payer: Medicare Other | Admitting: Cardiology

## 2013-04-10 ENCOUNTER — Encounter: Payer: Self-pay | Admitting: Cardiology

## 2013-04-10 VITALS — BP 153/83 | HR 70 | Ht 70.5 in | Wt 182.0 lb

## 2013-04-10 DIAGNOSIS — I2510 Atherosclerotic heart disease of native coronary artery without angina pectoris: Secondary | ICD-10-CM

## 2013-04-10 DIAGNOSIS — I4891 Unspecified atrial fibrillation: Secondary | ICD-10-CM

## 2013-04-10 DIAGNOSIS — Z950 Presence of cardiac pacemaker: Secondary | ICD-10-CM

## 2013-04-10 NOTE — Progress Notes (Signed)
HPI  This pleasant, remarkable 79 year old gentleman tells me today that he use his chainsaw to bring [facility] for the winter. He feels great. His pacemaker was checked by Dr. Johney Frame earlier this month. He has very brief episodes of atrial fibrillation. The patient does not want anticoagulation. He has known coronary disease and he is quite stable.  Allergies  Allergen Reactions  . Rosuvastatin     REACTION: blood pressure    Current Outpatient Prescriptions  Medication Sig Dispense Refill  . aspirin 325 MG tablet Take 325 mg by mouth daily.        [name] docusate sodium (COLACE) 100 MG capsule Take 100 mg by mouth 2 (two) times daily.        . propranolol (INDERAL) 40 MG tablet Take 40 mg by mouth daily.         No current facility-administered medications for this visit.    History   Social History  . Marital Status: Divorced    Spouse Name: N/A    Number of Children: N/A  . Years of Education: N/A   Occupational History  . DISABLED/RETIRED    Social History Main Topics  . Smoking status: Never Smoker   . Smokeless tobacco: Never Used  . Alcohol Use: No  . Drug Use: No  . Sexual Activity: Not on file   Other Topics Concern  . Not on file   Social History Narrative   Regular exercise--yes    No family history on file.  Past Medical History  Diagnosis Date  . Mitral regurgitation     mild...echo.Marland KitchenMarland KitchenFebruary 2010  . Hyperlipemia   . Hypertension   . Atrioventricular block, type II     s/p PPM  . Atrial flutter     s/p CTI ablation October 2010 by Dr Graciela Husbands  . CAD (coronary artery disease)     s/p CABG  . Ejection fraction     45%..echo.Marland KitchenMarland KitchenFebruary,2010/ EF 60%....TEE.Marland KitchenMarland KitchenOctober,2010  . PFO (patent foramen ovale)     small....TEE....October,2010  . Anticoagulant long-term use     coumadin therapy(in frequent mode switches February,2012)  . Hx of CABG   . Pacemaker     Past Surgical History  Procedure Laterality Date  . Cardiac pacemaker placement   06/21/2008    St. Jude dual-chamber permanent pacemaker  . Coronary artery bypass graft  06/17/2008    5 vessel  . Inguinal hernia repair      RIGHT  . Left lower extremity repair      from trauma in the distant past secondary to a fall  . Back surgery    . Total hip arthroplasty    . Total knee arthroplasty    . Atrial ablation surgery  2010    CTI ablation for atrial flutter by Dr Graciela Husbands 2010    Patient Active Problem List   Diagnosis Date Noted  . Hx of CABG   . Mitral regurgitation   . Hyperlipemia   . Hypertension   . PFO (patent foramen ovale)   . Ejection fraction   . Atrioventricular block, type II   . Atrial flutter   . Pacemaker-St.Jude 04/06/2012  . Long term current use of anticoagulant 07/10/2010  . Atrial fibrillation 05/04/2010  . CAD, NATIVE VESSEL 12/04/2008    ROS   Patient denies fever, chills, headache, sweats, rash, change in vision, change in hearing, chest pain, cough, nausea vomiting, urinary symptoms. All other systems are reviewed and are negative.  PHYSICAL EXAM  Patient looks fantastic.  He is oriented to person time and place. Affect is normal. There is no jugulovenous distention. Lungs are clear. Respiratory effort is nonlabored. Cardiac exam her vitals S1 and S2. There no clicks or significant murmurs. The abdomen is soft. There is no peripheral edema.  Filed Vitals:   04/10/13 1357  BP: 153/83  Pulse: 70  Height: 5' 10.5" (1.791 m)  Weight: 182 lb (82.555 kg)   EKG is done today and reviewed by me. He is paced.  ASSESSMENT & PLAN

## 2013-04-10 NOTE — Patient Instructions (Signed)

## 2013-04-10 NOTE — Assessment & Plan Note (Signed)
Patient underwent bypass surgery in 2010. He has had a superb result.

## 2013-04-10 NOTE — Assessment & Plan Note (Signed)
We know that he has very limited atrial fibrillation from his pacemaker interrogation. He does not want to be anticoagulated.

## 2013-04-10 NOTE — Assessment & Plan Note (Signed)
His pacemaker is working well. No change in therapy. 

## 2013-04-30 ENCOUNTER — Ambulatory Visit (INDEPENDENT_AMBULATORY_CARE_PROVIDER_SITE_OTHER): Payer: Medicare Other | Admitting: Internal Medicine

## 2013-04-30 ENCOUNTER — Encounter: Payer: Self-pay | Admitting: Internal Medicine

## 2013-04-30 VITALS — BP 156/91 | HR 78 | Ht 70.5 in | Wt 196.0 lb

## 2013-04-30 DIAGNOSIS — I4892 Unspecified atrial flutter: Secondary | ICD-10-CM

## 2013-04-30 DIAGNOSIS — I4891 Unspecified atrial fibrillation: Secondary | ICD-10-CM

## 2013-04-30 DIAGNOSIS — I441 Atrioventricular block, second degree: Secondary | ICD-10-CM

## 2013-04-30 DIAGNOSIS — I10 Essential (primary) hypertension: Secondary | ICD-10-CM

## 2013-04-30 LAB — MDC_IDC_ENUM_SESS_TYPE_INCLINIC
Battery Impedance: 1100 Ohm
Battery Voltage: 2.81 V
Lead Channel Pacing Threshold Amplitude: 0.5 V
Lead Channel Pacing Threshold Pulse Width: 0.4 ms
Lead Channel Sensing Intrinsic Amplitude: 2 mV
Lead Channel Setting Pacing Amplitude: 2 V
Lead Channel Setting Pacing Pulse Width: 0.4 ms
MDC IDC MSMT LEADCHNL RA IMPEDANCE VALUE: 392 Ohm
MDC IDC MSMT LEADCHNL RV IMPEDANCE VALUE: 520 Ohm
MDC IDC MSMT LEADCHNL RV PACING THRESHOLD AMPLITUDE: 0.75 V
MDC IDC MSMT LEADCHNL RV PACING THRESHOLD PULSEWIDTH: 0.4 ms
MDC IDC MSMT LEADCHNL RV SENSING INTR AMPL: 12 mV
MDC IDC PG SERIAL: 2238486
MDC IDC SESS DTM: 20150105100009
MDC IDC SET LEADCHNL RV SENSING SENSITIVITY: 2 mV

## 2013-04-30 NOTE — Patient Instructions (Signed)
   2 gm sodium diet - info sheet given Continue all current medications. Your physician wants you to follow up in: 6 months.  You will receive a reminder letter in the mail one-two months in advance.  If you don't receive a letter, please call our office to schedule the follow up appointment - Stephen Olsen Your physician wants you to follow up in:  1 year.  You will receive a reminder letter in the mail one-two months in advance.  If you don't receive a letter, please call our office to schedule the follow up appointment - Dr. Rayann Heman

## 2013-04-30 NOTE — Progress Notes (Signed)
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  Since last being seen in our clinic, the patient reports doing very well.  Today, he denies symptoms of palpitations, chest pain, shortness of breath,  lower extremity edema, dizziness, presyncope, or syncope.  The patient is otherwise without complaint today.   Past Medical History  Diagnosis Date  . Mitral regurgitation     mild...echo.Marland KitchenMarland KitchenFebruary 2010  . Hyperlipemia   . Hypertension   . Atrioventricular block, type II     s/p PPM  . Atrial flutter     s/p CTI ablation October 2010 by Dr Caryl Comes  . CAD (coronary artery disease)     s/p CABG  . Ejection fraction     45%..echo.Marland KitchenMarland KitchenFebruary,2010/ EF 60%....TEE.Marland KitchenMarland KitchenOctober,2010  . PFO (patent foramen ovale)     small....TEE....October,2010  . Anticoagulant long-term use     coumadin therapy(in frequent mode switches February,2012)  . Hx of CABG   . Pacemaker    Past Surgical History  Procedure Laterality Date  . Cardiac pacemaker placement  06/21/2008    St. Jude dual-chamber permanent pacemaker  . Coronary artery bypass graft  06/17/2008    5 vessel  . Inguinal hernia repair      RIGHT  . Left lower extremity repair      from trauma in the distant past secondary to a fall  . Back surgery    . Total hip arthroplasty    . Total knee arthroplasty    . Atrial ablation surgery  2010    CTI ablation for atrial flutter by Dr Caryl Comes 2010    Current Outpatient Prescriptions  Medication Sig Dispense Refill  . aspirin 325 MG tablet Take 325 mg by mouth daily.        [name] docusate sodium (COLACE) 100 MG capsule Take 100 mg by mouth 2 (two) times daily.        . propranolol (INDERAL) 40 MG tablet Take 40 mg by mouth daily.         No current facility-administered medications for this visit.    Physical Exam: Filed Vitals:   04/30/13 0924  BP: 156/91  Pulse: 78  Height: 5' 10.5" (1.791 m)   Weight: 196 lb (88.905 kg)  SpO2: 99%    GEN- The patient is well appearing, alert and oriented x 3 today.   Head- normocephalic, atraumatic Eyes-  Sclera clear, conjunctiva pink Ears- hearing intact Oropharynx- clear Lungs- Clear to ausculation bilaterally, normal work of breathing Chest- pacemaker pocket is well healed Heart- Regular rate and rhythm, no murmurs, rubs or gallops, PMI not laterally displaced GI- soft, NT, ND, + BS Extremities- no clubbing, cyanosis, trace edema  Pacemaker interrogation- reviewed in detail today,  See PACEART report  Assessment and Plan:  ATRIOVENTRICULAR BLOCK, MOBITZ TYPE II   Normal pacemaker function  See Pace Art report  No changes today   ATRIAL FIBRILLATION  Maintaining sinus rhythm  Longest afib episodes was only 50 seconds over the past year He is very clear in his decision to avoid anticoagulation  CAD, NATIVE VESSEL  Stable  No change required today   HYPERTENSION, UNSPECIFIED  Above goal today, though he reports good BP control at home.  2 gram sodium restriction advised  Return to the device clinic in 6 months I will see in a year

## 2013-07-31 ENCOUNTER — Encounter: Payer: Self-pay | Admitting: Internal Medicine

## 2013-07-31 DIAGNOSIS — I4891 Unspecified atrial fibrillation: Secondary | ICD-10-CM

## 2013-08-28 ENCOUNTER — Telehealth: Payer: Self-pay | Admitting: Cardiology

## 2013-08-28 MED ORDER — NITROGLYCERIN 0.4 MG SL SUBL: 0.4000 mg | SUBLINGUAL_TABLET | SUBLINGUAL | Status: AC | PRN

## 2013-08-28 NOTE — Telephone Encounter (Signed)
Patient c/o chest pain that started 3-4 days ago. Patient took 2 of his nitroglycerin and got relief. Patient said he isn't having chest pain now. No c/o of sob or dizziness. Patient said his nitroglycerin tablets were old. Nurse advised patient that a new prescription was sent to his pharmacy and if he had chest pain again and received no relief after 3rd dose, he needed to proceed to the ED for an evaluation.

## 2013-08-28 NOTE — Telephone Encounter (Signed)
Patient walked into office wanting to be seen sooner than is scheduled recalls.  He has been experiencing pain around his heart area and below heart.  Said that he took two out of date Nitro this morning to see if it would help and it did some.  Pain is still there but not as intense.   I asked him if he felt he should go across the street to [facility] ER and he told me no.  Told me that he felt better but not 100%.  I offered him our first available with Dr Bronson Ing on May 7th at 1:00 and instructed him that if he starts feeling any worse to go to the nearest ER if needed.    Also, told him that I would have his nurse contact him today

## 2013-08-29 NOTE — Telephone Encounter (Signed)
Please call the patient today to see how he's doing. Please add him on to one of our doctors in the [place] office to be seen today or tomorrow.

## 2013-08-30 ENCOUNTER — Encounter: Payer: Self-pay | Admitting: Cardiology

## 2013-08-30 ENCOUNTER — Ambulatory Visit (INDEPENDENT_AMBULATORY_CARE_PROVIDER_SITE_OTHER): Payer: Medicare Other | Admitting: Cardiology

## 2013-08-30 VITALS — BP 165/82 | HR 69 | Ht 70.5 in | Wt 187.0 lb

## 2013-08-30 DIAGNOSIS — I2510 Atherosclerotic heart disease of native coronary artery without angina pectoris: Secondary | ICD-10-CM

## 2013-08-30 DIAGNOSIS — R0789 Other chest pain: Secondary | ICD-10-CM

## 2013-08-30 DIAGNOSIS — I4891 Unspecified atrial fibrillation: Secondary | ICD-10-CM

## 2013-08-30 MED ORDER — IBUPROFEN 400 MG PO TABS: 400.0000 mg | ORAL_TABLET | Freq: Two times a day (BID) | ORAL | Status: DC

## 2013-08-30 NOTE — Patient Instructions (Signed)
   Ibuprofen 400mg  twice a day  X 5 days only, then stop - OTC Continue all other medications.   Follow up with Dr. Ron Parker next available

## 2013-08-30 NOTE — Progress Notes (Signed)
Clinical Summary Stephen Olsen is a 80 y.o.male regular patient of Dr Ron Parker, this is our first visit together. He is seen today as an add on patient for chest pain.   1. CAD - prior CABG 2010 (LIMA-LAD, SVG-D1, SVG-D2, SVG-LCX, SVG-PDA.  - echo 01/2009 LVEF 55-60%.  - Recent pain under left rib cage. Started 2 weeks ago. Very different from prior cardiac pain. Soreness pain under left rib cage that is moderate to severe. Worst with walking, better with sitting still. Worst with position. Started after cutting wood with chainsaw and moving it approx 2 weeks ago. Can last for long periods of time if he is moving, sitting still makes it resolve. Can be reproduced with deep breathing.  No SOB, no palpitations, no nausea, no diaphoresis. - took nitroglyerin yesterday with some improvement after 30 minutes.       Past Medical History  Diagnosis Date  . Mitral regurgitation     mild...echo.Marland KitchenMarland KitchenFebruary 2010  . Hyperlipemia   . Hypertension   . Atrioventricular block, type II     s/p PPM  . Atrial flutter     s/p CTI ablation October 2010 by Dr Caryl Comes  . CAD (coronary artery disease)     s/p CABG  . Ejection fraction     45%..echo.Marland KitchenMarland KitchenFebruary,2010/ EF 60%....TEE.Marland KitchenMarland KitchenOctober,2010  . PFO (patent foramen ovale)     small....TEE....October,2010  . Anticoagulant long-term use     coumadin therapy(in frequent mode switches February,2012)  . Hx of CABG   . Pacemaker      Allergies  Allergen Reactions  . Rosuvastatin     REACTION: blood pressure     Current Outpatient Prescriptions  Medication Sig Dispense Refill  . aspirin 325 MG tablet Take 325 mg by mouth daily.        [name] docusate sodium (COLACE) 100 MG capsule Take 100 mg by mouth 2 (two) times daily.        . nitroGLYCERIN (NITROSTAT) 0.4 MG SL tablet Place 1 tablet (0.4 mg total) under the tongue every 5 (five) minutes as needed for chest pain.  25 tablet  3  . propranolol (INDERAL) 40 MG tablet Take 40 mg by mouth daily.           No current facility-administered medications for this visit.     Past Surgical History  Procedure Laterality Date  . Cardiac pacemaker placement  06/21/2008    St. Jude dual-chamber permanent pacemaker  . Coronary artery bypass graft  06/17/2008    5 vessel  . Inguinal hernia repair      RIGHT  . Left lower extremity repair      from trauma in the distant past secondary to a fall  . Back surgery    . Total hip arthroplasty    . Total knee arthroplasty    . Atrial ablation surgery  2010    CTI ablation for atrial flutter by Dr Caryl Comes 2010     Allergies  Allergen Reactions  . Rosuvastatin     REACTION: blood pressure      No family history on file.   Social History Stephen Olsen reports that he has never smoked. He has never used smokeless tobacco. Stephen Olsen reports that he does not drink alcohol.   Review of Systems CONSTITUTIONAL: No weight loss, fever, chills, weakness or fatigue.  HEENT: Eyes: No visual loss, blurred vision, double vision or yellow sclerae.No hearing loss, sneezing, congestion, runny nose or sore throat.  SKIN: No rash  or itching.  CARDIOVASCULAR:  RESPIRATORY: No shortness of breath, cough or sputum.  GASTROINTESTINAL: No anorexia, nausea, vomiting or diarrhea. No abdominal pain or blood.  GENITOURINARY: No burning on urination, no polyuria NEUROLOGICAL: No headache, dizziness, syncope, paralysis, ataxia, numbness or tingling in the extremities. No change in bowel or bladder control.  MUSCULOSKELETAL: No muscle, back pain, joint pain or stiffness.  LYMPHATICS: No enlarged nodes. No history of splenectomy.  PSYCHIATRIC: No history of depression or anxiety.  ENDOCRINOLOGIC: No reports of sweating, cold or heat intolerance. No polyuria or polydipsia.  [name]   Physical Examination p 69 bp 165/82 Wt 187 lbs BMI 26 Gen: resting comfortably, no acute distress HEENT: no scleral icterus, pupils equal round and reactive, no palptable cervical  adenopathy,  CV: RRR, no m/r/g, no JVD, no carotid bruits Resp: Clear to auscultation bilaterally GI: abdomen is soft, non-tender, non-distended, normal bowel sounds, no hepatosplenomegaly MSK: extremities are warm, no edema.  Skin: warm, no rash Neuro:  no focal deficits Psych: appropriate affect   Diagnostic Studies 05/2008 Cath HEMODYNAMIC DATA:  1. Central aortic pressure 113/64, mean 84.  2. Left ventricular pressure 106/12.  3. There was no gradient or pullback across the aortic valve.  ANGIOGRAPHIC DATA:  1. Ventriculography was done in the RAO projection. There was severe  hypo to near akinesis of the inferior myocardial segment with an  ejection fraction that appeared to be in the range of about 40-45%.  There did not appear to be significant mitral regurgitation.  2. The left coronary artery demonstrates a left main without critical  obstruction.  3. The LAD narrows down and has about 50% focal narrowing just after  the takeoff of the diagonal. Just distal to this is a 70% area of  focal stenosis at the septal perforator. The distal vessel has  luminal irregularity but is suitable for grafting. The large  diagonal Macario Shear also has about a 95% area of proximal stenosis with  a daylight lesion. The distal vessel here also appears to be  suitable for grafting with some mild irregularity.  4. The circumflex is a fairly large-caliber vessel providing mainly 1  large marginal Zareah Hunzeker. The circumflex has about an 80% area of  ostial stenosis with about 95% just distal to the takeoff of a  small marginal. The first marginal has 90% narrowing but is a  small-caliber vessel. The remaining portion of the circumflex has  about 50% narrowing and supplies a large marginal and suitable for  grafting.  5. The right coronary artery has about 70% diffuse disease in the  proximal mid segment, and then it is totally occluded with  extensive collaterals from left to right. The PDA itself  appears  to be large in caliber, graftable with possible graftability of the  posterolateral as well.  CONCLUSIONS:  1. Moderate reduction in left ventricular function with a fairly large  inferior wall motion abnormality.  2. Total occlusion of the right coronary artery with extensive left-to-  right collateralization.  3. Critical disease of both the circumflex and diagonal Roldan Laforest with  moderately high-grade disease of the left anterior descending.  CONCLUSION: The patient has significant left ventricular dysfunction,  LAD diagonal disease, as well as extensive RCA and circumflex disease.  Based on the above findings, revascularization surgery would be  preferred. A surgical consultation will be obtained. This has been  explained to the patient and family, and he is agreeable.  01/2009 Echo Study Conclusions  1. Left ventricle: Systolic function  was normal. The estimated    ejection fraction was in the range of 55% to 60%. 2. Aortic valve: No evidence of vegetation. 3. Mitral valve: No evidence of vegetation. Mild regurgitation. 4. Left atrium: The atrium was dilated. No evidence of thrombus in    the atrial cavity or appendage. 5. Atrial septum: There was a small patent foramen ovale. 6. Pulmonic valve: No evidence of vegetation.   08/30/13 Clinic EKG V-paced  Assessment and Plan  1. Left rib pain - non-cardiac pain. Pain left sided in the lower levels of the left rib cage, worst with position, deep breathing, and movment. Resolves with sitting completely still. Started after cutting and hauling wood - he reports better with NG, however symptoms resolved 30 minutes after NG and likely there is no relation - likely muskuloskeleltal pain, will give 5 day course of ibuprofen 400mg  bid, counseled to drink plenty of water while taking   - f/u with Dr Ron Parker next available to follow up symptoms      Arnoldo Lenis, M.D., F.A.C.C.

## 2013-09-20 ENCOUNTER — Ambulatory Visit (INDEPENDENT_AMBULATORY_CARE_PROVIDER_SITE_OTHER): Payer: Medicare Other | Admitting: Cardiology

## 2013-09-20 ENCOUNTER — Encounter: Payer: Self-pay | Admitting: Cardiology

## 2013-09-20 VITALS — BP 164/90 | HR 75 | Ht 70.0 in | Wt 184.1 lb

## 2013-09-20 DIAGNOSIS — I10 Essential (primary) hypertension: Secondary | ICD-10-CM

## 2013-09-20 DIAGNOSIS — I2510 Atherosclerotic heart disease of native coronary artery without angina pectoris: Secondary | ICD-10-CM

## 2013-09-20 NOTE — Patient Instructions (Signed)

## 2013-09-20 NOTE — Progress Notes (Signed)
Patient ID: Stephen Olsen, male   DOB: 1924-06-11, 80 y.o.   MRN: [id number]    HPI  Patient is seen back today to followup his cardiac status. He had been added to the schedule of Dr. Harl Bowie recently on Aug 30, 2013. It turned out that he had musculoskeletal pain. He took ibuprofen for 5 days. He is extremely pleased with the result. He's doing fine. He's here today to be sure that he was stable.  Allergies  Allergen Reactions  . Rosuvastatin     REACTION: blood pressure    Current Outpatient Prescriptions  Medication Sig Dispense Refill  . aspirin 325 MG tablet Take 325 mg by mouth daily.        [name] docusate sodium (COLACE) 100 MG capsule Take 100 mg by mouth 2 (two) times daily.        . nitroGLYCERIN (NITROSTAT) 0.4 MG SL tablet Place 1 tablet (0.4 mg total) under the tongue every 5 (five) minutes as needed for chest pain.  25 tablet  3  . propranolol (INDERAL) 40 MG tablet Take 40 mg by mouth daily.         No current facility-administered medications for this visit.    History   Social History  . Marital Status: Divorced    Spouse Name: N/A    Number of Children: N/A  . Years of Education: N/A   Occupational History  . DISABLED/RETIRED    Social History Main Topics  . Smoking status: Never Smoker   . Smokeless tobacco: Never Used  . Alcohol Use: No  . Drug Use: No  . Sexual Activity: Not on file   Other Topics Concern  . Not on file   Social History Narrative   Regular exercise--yes    No family history on file.  Past Medical History  Diagnosis Date  . Mitral regurgitation     mild...echo.Marland KitchenMarland KitchenFebruary 2010  . Hyperlipemia   . Hypertension   . Atrioventricular block, type II     s/p PPM  . Atrial flutter     s/p CTI ablation October 2010 by Dr Caryl Comes  . CAD (coronary artery disease)     s/p CABG  . Ejection fraction     45%..echo.Marland KitchenMarland KitchenFebruary,2010/ EF 60%....TEE.Marland KitchenMarland KitchenOctober,2010  . PFO (patent foramen ovale)     small....TEE....October,2010  . Anticoagulant  long-term use     coumadin therapy(in frequent mode switches February,2012)  . Hx of CABG   . Pacemaker     Past Surgical History  Procedure Laterality Date  . Cardiac pacemaker placement  06/21/2008    St. Jude dual-chamber permanent pacemaker  . Coronary artery bypass graft  06/17/2008    5 vessel  . Inguinal hernia repair      RIGHT  . Left lower extremity repair      from trauma in the distant past secondary to a fall  . Back surgery    . Total hip arthroplasty    . Total knee arthroplasty    . Atrial ablation surgery  2010    CTI ablation for atrial flutter by Dr Caryl Comes 2010    Patient Active Problem List   Diagnosis Date Noted  . Hx of CABG   . Mitral regurgitation   . Hyperlipemia   . Hypertension   . PFO (patent foramen ovale)   . Ejection fraction   . Atrioventricular block, type II   . Atrial flutter   . Pacemaker-St.Jude 04/06/2012  . Long term current use of anticoagulant 07/10/2010  .  Atrial fibrillation 05/04/2010  . CAD, NATIVE VESSEL 12/04/2008     ROS   Patient denies fever, chills, headache, sweats, rash, change in vision, change in hearing, chest pain, cough, nausea vomiting, urinary symptoms. All other systems are reviewed and are negative.  PHYSICAL EXAM  Patient looks great today. He is oriented to person time and place. Affect is normal. There is no jugulovenous distention. Lungs are clear. Respiratory effort is nonlabored. Cardiac exam reveals S1-S2. Abdomen is soft. Is no peripheral edema. There no musculoskeletal deformities. There are no skin rashes. Head is atraumatic.  Filed Vitals:   09/20/13 1610  BP: 164/90  Pulse: 75  Height: 5\' 10"  (1.778 m)  Weight: 184 lb 1.9 oz (83.516 kg)  SpO2: 97%     ASSESSMENT & PLAN

## 2013-09-20 NOTE — Assessment & Plan Note (Signed)
Blood pressure today is on the mildly elevated side. It was slightly lower when he was in the office earlier this month. I am not inclined to change his medicine.

## 2013-09-20 NOTE — Assessment & Plan Note (Signed)
Coronary disease is stable. His recent chest discomfort was musculoskeletal. He is stable. No further workup needed.

## 2013-10-19 ENCOUNTER — Ambulatory Visit (INDEPENDENT_AMBULATORY_CARE_PROVIDER_SITE_OTHER): Payer: Medicare Other | Admitting: *Deleted

## 2013-10-19 DIAGNOSIS — I4891 Unspecified atrial fibrillation: Secondary | ICD-10-CM

## 2013-10-19 DIAGNOSIS — I441 Atrioventricular block, second degree: Secondary | ICD-10-CM

## 2013-10-19 DIAGNOSIS — I4819 Other persistent atrial fibrillation: Secondary | ICD-10-CM

## 2013-10-19 LAB — MDC_IDC_ENUM_SESS_TYPE_INCLINIC
Battery Impedance: 1300 Ohm
Brady Statistic RA Percent Paced: 99 %
Brady Statistic RV Percent Paced: 99 %
Date Time Interrogation Session: 20150626133121
Implantable Pulse Generator Model: 5826
Lead Channel Impedance Value: 469 Ohm
Lead Channel Pacing Threshold Amplitude: 0.5 V
Lead Channel Pacing Threshold Pulse Width: 0.4 ms
Lead Channel Pacing Threshold Pulse Width: 0.4 ms
Lead Channel Sensing Intrinsic Amplitude: 2 mV
Lead Channel Setting Pacing Amplitude: 2 V
Lead Channel Setting Pacing Pulse Width: 0.4 ms
MDC IDC MSMT BATTERY VOLTAGE: 2.8 V
MDC IDC MSMT LEADCHNL RA IMPEDANCE VALUE: 374 Ohm
MDC IDC MSMT LEADCHNL RV PACING THRESHOLD AMPLITUDE: 0.75 V
MDC IDC PG SERIAL: 2238486
MDC IDC SET LEADCHNL RV SENSING SENSITIVITY: 2 mV

## 2013-10-19 NOTE — Progress Notes (Signed)
Pacemaker check in clinic. Battery longevity 4.25 to 5.50 years. Normal device function. No episodes recorded since last check. Underlying Sinus w/CHB (paced @ 30). ROV in 6 mths w/JA in [place].

## 2013-10-31 ENCOUNTER — Encounter: Payer: Self-pay | Admitting: Internal Medicine

## 2014-01-21 ENCOUNTER — Encounter: Payer: Self-pay | Admitting: Internal Medicine

## 2014-01-21 DIAGNOSIS — I4891 Unspecified atrial fibrillation: Secondary | ICD-10-CM

## 2014-03-10 ENCOUNTER — Encounter: Payer: Self-pay | Admitting: Cardiology

## 2014-03-11 ENCOUNTER — Ambulatory Visit (INDEPENDENT_AMBULATORY_CARE_PROVIDER_SITE_OTHER): Payer: Medicare Other | Admitting: Cardiology

## 2014-03-11 ENCOUNTER — Encounter: Payer: Self-pay | Admitting: Cardiology

## 2014-03-11 VITALS — BP 155/79 | HR 70 | Ht 71.0 in | Wt 182.0 lb

## 2014-03-11 DIAGNOSIS — I2510 Atherosclerotic heart disease of native coronary artery without angina pectoris: Secondary | ICD-10-CM

## 2014-03-11 DIAGNOSIS — Z951 Presence of aortocoronary bypass graft: Secondary | ICD-10-CM

## 2014-03-11 DIAGNOSIS — I25810 Atherosclerosis of coronary artery bypass graft(s) without angina pectoris: Secondary | ICD-10-CM

## 2014-03-11 DIAGNOSIS — I10 Essential (primary) hypertension: Secondary | ICD-10-CM

## 2014-03-11 NOTE — Patient Instructions (Signed)
Continue all current medications. Your physician wants you to follow up in: 6 months.  You will receive a reminder letter in the mail one-two months in advance.  If you don't receive a letter, please call our office to schedule the follow up appointment   

## 2014-03-11 NOTE — Assessment & Plan Note (Signed)
Coronary disease is stable. No change in therapy. 

## 2014-03-11 NOTE — Assessment & Plan Note (Signed)
He is doing very well since his bypass surgery in 2010. I talked to him about changing his aspirin to 81 mg. He does not want to change because he feels that his current regimen is excellent for him.

## 2014-03-11 NOTE — Progress Notes (Signed)
Patient ID: Stephen Olsen, male   DOB: 1924/11/12, 80 y.o.   MRN: [id number]    HPI  patient is seen today to follow-up coronary artery disease. He underwent bypass surgery in February, 2010. He will be 89 soon. He is doing great. He's not having any chest pain or shortness of breath. He continues to be active. This includes cutting a small amount of wood at his own pace.  Allergies  Allergen Reactions  . Rosuvastatin     REACTION: blood pressure    Current Outpatient Prescriptions  Medication Sig Dispense Refill  . aspirin 325 MG tablet Take 325 mg by mouth daily.      [name] docusate sodium (COLACE) 100 MG capsule Take 100 mg by mouth 2 (two) times daily.      . nitroGLYCERIN (NITROSTAT) 0.4 MG SL tablet Place 1 tablet (0.4 mg total) under the tongue every 5 (five) minutes as needed for chest pain. 25 tablet 3  . propranolol (INDERAL) 40 MG tablet Take 40 mg by mouth daily.       No current facility-administered medications for this visit.    History   Social History  . Marital Status: Divorced    Spouse Name: N/A    Number of Children: N/A  . Years of Education: N/A   Occupational History  . DISABLED/RETIRED    Social History Main Topics  . Smoking status: Never Smoker   . Smokeless tobacco: Never Used  . Alcohol Use: No  . Drug Use: No  . Sexual Activity: Not on file   Other Topics Concern  . Not on file   Social History Narrative   Regular exercise--yes    History reviewed. No pertinent family history.  Past Medical History  Diagnosis Date  . Mitral regurgitation     mild...echo.Marland KitchenMarland KitchenFebruary 2010  . Hyperlipemia   . Hypertension   . Atrioventricular block, type II     s/p PPM  . Atrial flutter     s/p CTI ablation October 2010 by Dr Caryl Comes  . CAD (coronary artery disease)     s/p CABG  . Ejection fraction     45%..echo.Marland KitchenMarland KitchenFebruary,2010/ EF 60%....TEE.Marland KitchenMarland KitchenOctober,2010  . PFO (patent foramen ovale)     small....TEE....October,2010  . Anticoagulant long-term use      coumadin therapy(in frequent mode switches February,2012)  . Hx of CABG   . Pacemaker     Past Surgical History  Procedure Laterality Date  . Cardiac pacemaker placement  06/21/2008    St. Jude dual-chamber permanent pacemaker  . Coronary artery bypass graft  06/17/2008    5 vessel  . Inguinal hernia repair      RIGHT  . Left lower extremity repair      from trauma in the distant past secondary to a fall  . Back surgery    . Total hip arthroplasty    . Total knee arthroplasty    . Atrial ablation surgery  2010    CTI ablation for atrial flutter by Dr Caryl Comes 2010    Patient Active Problem List   Diagnosis Date Noted  . Hx of CABG   . Mitral regurgitation   . Hyperlipemia   . Hypertension   . PFO (patent foramen ovale)   . Ejection fraction   . Atrioventricular block, type II   . Atrial flutter   . Pacemaker-St.Jude 04/06/2012  . Long term current use of anticoagulant 07/10/2010  . Paroxysmal atrial fibrillation 05/04/2010  . CAD, NATIVE VESSEL 12/04/2008  ROS   patient denies fever, chills, headache, sweats, rash, change in vision, change in hearing, chest pain, cough, nausea or vomiting, urinary symptoms. All other systems are reviewed and are negative.  PHYSICAL EXAM  The patient looks great. He is oriented to person time and place. Affect is normal. Head is atraumatic. Sclera and conjunctiva are normal. There is no jugular venous distention. Lungs are clear. Respiratory effort is nonlabored. Cardiac exam reveals S1 and S2. Abdomen is soft. There is no peripheral edema.  Filed Vitals:   03/11/14 1004  BP: 155/79  Pulse: 70  Height: 5\' 11"  (1.803 m)  Weight: 182 lb (82.555 kg)  SpO2: 100%     ASSESSMENT & PLAN

## 2014-03-11 NOTE — Assessment & Plan Note (Signed)
Blood pressure stable. No change in therapy.

## 2014-04-22 DIAGNOSIS — I4891 Unspecified atrial fibrillation: Secondary | ICD-10-CM

## 2014-05-20 ENCOUNTER — Encounter: Payer: Self-pay | Admitting: Internal Medicine

## 2014-05-30 ENCOUNTER — Encounter: Payer: Self-pay | Admitting: *Deleted

## 2014-06-20 ENCOUNTER — Encounter: Payer: Self-pay | Admitting: *Deleted

## 2014-07-22 ENCOUNTER — Encounter: Payer: Self-pay | Admitting: Internal Medicine

## 2014-07-22 DIAGNOSIS — I4891 Unspecified atrial fibrillation: Secondary | ICD-10-CM | POA: Diagnosis not present

## 2014-07-25 ENCOUNTER — Encounter: Payer: Self-pay | Admitting: *Deleted

## 2014-08-05 ENCOUNTER — Telehealth: Payer: Self-pay | Admitting: Internal Medicine

## 2014-08-05 NOTE — Telephone Encounter (Signed)
New message        Pt is getting his device checked per daughter calling

## 2014-08-05 NOTE — Telephone Encounter (Signed)
Called and left a message for daughter to call back and clarify her message.  I don't know if he needs to have his device checked or is getting his device checked.  Just not sure what this message is about.  Please call back to verify and clarify message

## 2014-08-21 ENCOUNTER — Encounter: Payer: Self-pay | Admitting: *Deleted

## 2014-09-11 ENCOUNTER — Ambulatory Visit (INDEPENDENT_AMBULATORY_CARE_PROVIDER_SITE_OTHER): Payer: Medicare Other | Admitting: Cardiology

## 2014-09-11 ENCOUNTER — Encounter: Payer: Self-pay | Admitting: Cardiology

## 2014-09-11 VITALS — BP 173/98 | HR 69 | Ht 71.0 in | Wt 188.4 lb

## 2014-09-11 DIAGNOSIS — I480 Paroxysmal atrial fibrillation: Secondary | ICD-10-CM

## 2014-09-11 DIAGNOSIS — I25810 Atherosclerosis of coronary artery bypass graft(s) without angina pectoris: Secondary | ICD-10-CM | POA: Diagnosis not present

## 2014-09-11 DIAGNOSIS — I4892 Unspecified atrial flutter: Secondary | ICD-10-CM | POA: Diagnosis not present

## 2014-09-11 DIAGNOSIS — Z950 Presence of cardiac pacemaker: Secondary | ICD-10-CM

## 2014-09-11 DIAGNOSIS — I10 Essential (primary) hypertension: Secondary | ICD-10-CM

## 2014-09-11 DIAGNOSIS — Z7901 Long term (current) use of anticoagulants: Secondary | ICD-10-CM

## 2014-09-11 NOTE — Patient Instructions (Signed)
Your physician recommends that you continue on your current medications as directed. Please refer to the Current Medication list given to you today. See Erasmo Downer tomorrow on 09/12/14 @10 :00 am to have your device checked. Your physician recommends that you schedule a follow-up appointment in: 6 months. You will receive a reminder letter in the mail in about 4 months reminding you to call and schedule your appointment. If you don't receive this letter, please contact our office.

## 2014-09-11 NOTE — Assessment & Plan Note (Signed)
He has very limited atrial fibrillation. He does not want to be anticoagulated.

## 2014-09-11 NOTE — Assessment & Plan Note (Signed)
Blood pressure is elevated today. It is very hard to get more information about his blood pressure. We will be contacting his primary team to see what information they have. I'm very hasn't had to adjust his medicines.

## 2014-09-11 NOTE — Assessment & Plan Note (Signed)
Our team is trying aggressively to be sure that we are following his pacemaker. It was working well when checked last.

## 2014-09-11 NOTE — Progress Notes (Signed)
Cardiology Office Note   Date:  09/11/2014   ID:  Sherryll Burger, DOB 1925-01-30, MRN [id number]  PCP:  Glenda Chroman., MD  Cardiologist:  Dola Argyle, MD   Chief Complaint  Patient presents with  . Appointment    follow-up coronary disease.      History of Present Illness: Stephen Olsen is a 81 y.o. male who presents today to follow-up coronary disease. He underwent bypass surgery in 2010. He is 81 years of age. He is not having any significant chest pain or shortness of breath. He is very active. He has a pacemaker. Most recently there has been difficulty getting information to assess his pacemaker. We know that one year ago, his pacer check was good and he had 4.5 year battery life. It was also noted that he had only rare atrial fibrillation. At that time the patient made it very clear that he did not want anticoagulation.    Past Medical History  Diagnosis Date  . Mitral regurgitation     mild...echo.Marland KitchenMarland KitchenFebruary 2010  . Hyperlipemia   . Hypertension   . Atrioventricular block, type II     s/p PPM  . Atrial flutter     s/p CTI ablation October 2010 by Dr Caryl Comes  . CAD (coronary artery disease)     s/p CABG  . Ejection fraction     45%..echo.Marland KitchenMarland KitchenFebruary,2010/ EF 60%....TEE.Marland KitchenMarland KitchenOctober,2010  . PFO (patent foramen ovale)     small....TEE....October,2010  . Anticoagulant long-term use     coumadin therapy(in frequent mode switches February,2012)  . Hx of CABG   . Pacemaker     Past Surgical History  Procedure Laterality Date  . Cardiac pacemaker placement  06/21/2008    St. Jude dual-chamber permanent pacemaker  . Coronary artery bypass graft  06/17/2008    5 vessel  . Inguinal hernia repair      RIGHT  . Left lower extremity repair      from trauma in the distant past secondary to a fall  . Back surgery    . Total hip arthroplasty    . Total knee arthroplasty    . Atrial ablation surgery  2010    CTI ablation for atrial flutter by Dr Caryl Comes 2010    Patient  Active Problem List   Diagnosis Date Noted  . Hx of CABG   . Mitral regurgitation   . Hyperlipemia   . Hypertension   . PFO (patent foramen ovale)   . Ejection fraction   . Atrioventricular block, type II   . Atrial flutter   . Pacemaker-St.Jude 04/06/2012  . Long term current use of anticoagulant 07/10/2010  . Paroxysmal atrial fibrillation 05/04/2010  . CAD, NATIVE VESSEL 12/04/2008      Current Outpatient Prescriptions  Medication Sig Dispense Refill  . aspirin 325 MG tablet Take 325 mg by mouth daily.      [name] docusate sodium (COLACE) 100 MG capsule Take 100 mg by mouth 2 (two) times daily.      . propranolol (INDERAL) 40 MG tablet Take 60 mg by mouth daily. States he increased himself due to swelling in his ankles.    . nitroGLYCERIN (NITROSTAT) 0.4 MG SL tablet Place 1 tablet (0.4 mg total) under the tongue every 5 (five) minutes as needed for chest pain. (Patient not taking: Reported on 09/11/2014) 25 tablet 3   No current facility-administered medications for this visit.    Allergies:   Rosuvastatin    Social History:  The patient  reports that he has never smoked. He has never used smokeless tobacco. He reports that he does not drink alcohol or use illicit drugs.   Family History:  There is no significant family history of coronary artery disease.   ROS:  Please see the history of present illness.   Patient denies fever, chills, headache, sweats, rash, change in vision, change in hearing, chest pain, cough, nausea or vomiting, urinary symptoms. All other systems are reviewed and are negative.     PHYSICAL EXAM: VS:  BP 173/98 mmHg  Pulse 69  Ht 5\' 11"  (1.803 m)  Wt 188 lb 6.4 oz (85.458 kg)  BMI 26.29 kg/m2  SpO2 98% , The patient is oriented to person time and place. Affect is normal. He became upset when we asked him about pacemaker monitoring. We will arrange for follow-up. He looks good. Head is atraumatic. Sclera and conjunctiva are normal. There is no  jugulovenous distention. Lungs are clear. Respiratory effort is nonlabored. Cardiac exam reveals an S1 and S2. The abdomen is soft. There is no peripheral edema. There are no musculoskeletal deformities. There are no skin rashes.  EKG:   EKG is done today and reviewed by me. The rhythm is paced.   Recent Labs: No results found for requested labs within last 365 days.    Lipid Panel    Component Value Date/Time   CHOL  06/10/2008 0445    191        ATP III CLASSIFICATION:  <200     mg/dL   Desirable  200-239  mg/dL   Borderline High  >=240    mg/dL   High          TRIG 113 06/10/2008 0445   HDL 28* 06/10/2008 0445   CHOLHDL 6.8 06/10/2008 0445   VLDL 23 06/10/2008 0445   LDLCALC * 06/10/2008 0445    140        Total Cholesterol/HDL:CHD Risk Coronary Heart Disease Risk Table                     Men   Women  1/2 Average Risk   3.4   3.3  Average Risk       5.0   4.4  2 X Average Risk   9.6   7.1  3 X Average Risk  23.4   11.0        Use the calculated Patient Ratio above and the CHD Risk Table to determine the patient's CHD Risk.        ATP III CLASSIFICATION (LDL):  <100     mg/dL   Optimal  100-129  mg/dL   Near or Above                    Optimal  130-159  mg/dL   Borderline  160-189  mg/dL   High  >190     mg/dL   Very High      Wt Readings from Last 3 Encounters:  09/11/14 188 lb 6.4 oz (85.458 kg)  03/11/14 182 lb (82.555 kg)  09/20/13 184 lb 1.9 oz (83.516 kg)      Current medicines are reviewed  The patient may not completely understand his medications. We encouraged him strongly not to change any of his medicines without his doctor's being aware    ASSESSMENT AND PLAN:

## 2014-09-11 NOTE — Assessment & Plan Note (Signed)
Patient had atrial flutter ablation in the past.

## 2014-09-11 NOTE — Assessment & Plan Note (Signed)
He underwent CABG in 2010. He is doing well. His aspirin dose can be reduced to 81 mg.

## 2014-09-11 NOTE — Assessment & Plan Note (Signed)
The patient has made it very clear that he does not want to be anticoagulated.

## 2014-09-12 ENCOUNTER — Ambulatory Visit (INDEPENDENT_AMBULATORY_CARE_PROVIDER_SITE_OTHER): Payer: Medicare Other | Admitting: *Deleted

## 2014-09-12 DIAGNOSIS — I441 Atrioventricular block, second degree: Secondary | ICD-10-CM | POA: Diagnosis not present

## 2014-09-12 DIAGNOSIS — I483 Typical atrial flutter: Secondary | ICD-10-CM

## 2014-09-12 LAB — CUP PACEART INCLINIC DEVICE CHECK
Battery Impedance: 1900 Ohm
Battery Voltage: 2.8 V
Lead Channel Impedance Value: 382 Ohm
Lead Channel Pacing Threshold Amplitude: 0.75 V
Lead Channel Pacing Threshold Pulse Width: 0.4 ms
Lead Channel Setting Pacing Pulse Width: 0.4 ms
Lead Channel Setting Sensing Sensitivity: 2 mV
MDC IDC MSMT LEADCHNL RA PACING THRESHOLD AMPLITUDE: 0.75 V
MDC IDC MSMT LEADCHNL RA PACING THRESHOLD PULSEWIDTH: 0.4 ms
MDC IDC MSMT LEADCHNL RA SENSING INTR AMPL: 2 mV
MDC IDC MSMT LEADCHNL RV IMPEDANCE VALUE: 486 Ohm
MDC IDC MSMT LEADCHNL RV SENSING INTR AMPL: 12 mV
MDC IDC PG SERIAL: 2238486
MDC IDC SESS DTM: 20160519102650
MDC IDC SET LEADCHNL RA PACING AMPLITUDE: 2 V
MDC IDC STAT BRADY RA PERCENT PACED: 99 %
MDC IDC STAT BRADY RV PERCENT PACED: 99 %

## 2014-09-12 NOTE — Progress Notes (Signed)
Normal pacer check. Battery longevity 3.25 to 4.25 years. 1 mode switch episode lasting 10 seconds. No changes made. ROV 03-28-15 @ 930 with JA/[place].

## 2014-09-24 ENCOUNTER — Encounter: Payer: Self-pay | Admitting: Internal Medicine

## 2015-01-03 ENCOUNTER — Inpatient Hospital Stay (HOSPITAL_COMMUNITY)
Admission: EM | Admit: 2015-01-03 | Discharge: 2015-01-04 | DRG: 176 | Disposition: A | Discharge status: Home / Self Care | Payer: Medicare Other | Attending: Emergency Medicine | Admitting: Emergency Medicine

## 2015-01-03 ENCOUNTER — Emergency Department (HOSPITAL_COMMUNITY): Payer: Medicare Other

## 2015-01-03 ENCOUNTER — Encounter (HOSPITAL_COMMUNITY): Payer: Self-pay | Admitting: Emergency Medicine

## 2015-01-03 DIAGNOSIS — R079 Chest pain, unspecified: Secondary | ICD-10-CM | POA: Diagnosis present

## 2015-01-03 DIAGNOSIS — N400 Enlarged prostate without lower urinary tract symptoms: Secondary | ICD-10-CM | POA: Diagnosis present

## 2015-01-03 DIAGNOSIS — Z86711 Personal history of pulmonary embolism: Secondary | ICD-10-CM

## 2015-01-03 DIAGNOSIS — S3991XA Unspecified injury of abdomen, initial encounter: Secondary | ICD-10-CM | POA: Diagnosis present

## 2015-01-03 DIAGNOSIS — Y92009 Unspecified place in unspecified non-institutional (private) residence as the place of occurrence of the external cause: Secondary | ICD-10-CM

## 2015-01-03 DIAGNOSIS — R1011 Right upper quadrant pain: Secondary | ICD-10-CM

## 2015-01-03 DIAGNOSIS — W1839XA Other fall on same level, initial encounter: Secondary | ICD-10-CM | POA: Diagnosis not present

## 2015-01-03 DIAGNOSIS — I2510 Atherosclerotic heart disease of native coronary artery without angina pectoris: Secondary | ICD-10-CM | POA: Diagnosis present

## 2015-01-03 DIAGNOSIS — I10 Essential (primary) hypertension: Secondary | ICD-10-CM | POA: Diagnosis present

## 2015-01-03 DIAGNOSIS — Z96659 Presence of unspecified artificial knee joint: Secondary | ICD-10-CM | POA: Diagnosis not present

## 2015-01-03 DIAGNOSIS — Z79899 Other long term (current) drug therapy: Secondary | ICD-10-CM | POA: Diagnosis not present

## 2015-01-03 DIAGNOSIS — Z8546 Personal history of malignant neoplasm of prostate: Secondary | ICD-10-CM | POA: Diagnosis not present

## 2015-01-03 DIAGNOSIS — I340 Nonrheumatic mitral (valve) insufficiency: Secondary | ICD-10-CM | POA: Diagnosis not present

## 2015-01-03 DIAGNOSIS — Z96649 Presence of unspecified artificial hip joint: Secondary | ICD-10-CM | POA: Diagnosis not present

## 2015-01-03 DIAGNOSIS — Z951 Presence of aortocoronary bypass graft: Secondary | ICD-10-CM | POA: Diagnosis not present

## 2015-01-03 DIAGNOSIS — I2699 Other pulmonary embolism without acute cor pulmonale: Principal | ICD-10-CM | POA: Diagnosis present

## 2015-01-03 DIAGNOSIS — Z7901 Long term (current) use of anticoagulants: Secondary | ICD-10-CM

## 2015-01-03 DIAGNOSIS — E785 Hyperlipidemia, unspecified: Secondary | ICD-10-CM | POA: Diagnosis present

## 2015-01-03 DIAGNOSIS — Z7982 Long term (current) use of aspirin: Secondary | ICD-10-CM | POA: Diagnosis not present

## 2015-01-03 DIAGNOSIS — W19XXXA Unspecified fall, initial encounter: Secondary | ICD-10-CM

## 2015-01-03 DIAGNOSIS — Z950 Presence of cardiac pacemaker: Secondary | ICD-10-CM

## 2015-01-03 DIAGNOSIS — I480 Paroxysmal atrial fibrillation: Secondary | ICD-10-CM | POA: Diagnosis present

## 2015-01-03 HISTORY — DX: Benign prostatic hyperplasia without lower urinary tract symptoms: N40.0

## 2015-01-03 HISTORY — DX: Other pulmonary embolism without acute cor pulmonale: I26.99

## 2015-01-03 HISTORY — DX: Malignant neoplasm of prostate: C61

## 2015-01-03 LAB — CBC WITH DIFFERENTIAL/PLATELET
Basophils Absolute: 0 10*3/uL (ref 0.0–0.1)
Basophils Relative: 0 % (ref 0–1)
Eosinophils Absolute: 0.1 10*3/uL (ref 0.0–0.7)
Eosinophils Relative: 1 % (ref 0–5)
HEMATOCRIT: 38.4 % — AB (ref 39.0–52.0)
HEMOGLOBIN: 13 g/dL (ref 13.0–17.0)
Lymphocytes Relative: 13 % (ref 12–46)
Lymphs Abs: 1.4 10*3/uL (ref 0.7–4.0)
MCH: 32.8 pg (ref 26.0–34.0)
MCHC: 33.9 g/dL (ref 30.0–36.0)
MCV: 97 fL (ref 78.0–100.0)
MONOS PCT: 10 % (ref 3–12)
Monocytes Absolute: 1.1 10*3/uL — ABNORMAL HIGH (ref 0.1–1.0)
NEUTROS ABS: 8.2 10*3/uL — AB (ref 1.7–7.7)
NEUTROS PCT: 76 % (ref 43–77)
PLATELETS: 150 10*3/uL (ref 150–400)
RBC: 3.96 MIL/uL — ABNORMAL LOW (ref 4.22–5.81)
RDW: 13.2 % (ref 11.5–15.5)
WBC: 10.8 10*3/uL — ABNORMAL HIGH (ref 4.0–10.5)

## 2015-01-03 LAB — COMPREHENSIVE METABOLIC PANEL
ALBUMIN: 3.7 g/dL (ref 3.5–5.0)
ALT: 11 U/L — AB (ref 17–63)
AST: 14 U/L — ABNORMAL LOW (ref 15–41)
Alkaline Phosphatase: 71 U/L (ref 38–126)
Anion gap: 6 (ref 5–15)
BUN: 24 mg/dL — ABNORMAL HIGH (ref 6–20)
CO2: 26 mmol/L (ref 22–32)
Calcium: 8.6 mg/dL — ABNORMAL LOW (ref 8.9–10.3)
Chloride: 105 mmol/L (ref 101–111)
Creatinine, Ser: 1.12 mg/dL (ref 0.61–1.24)
GFR calc non Af Amer: 57 mL/min — ABNORMAL LOW (ref >60)
Glucose, Bld: 110 mg/dL — ABNORMAL HIGH (ref 65–99)
Potassium: 4.4 mmol/L (ref 3.5–5.1)
SODIUM: 137 mmol/L (ref 135–145)
Total Bilirubin: 1.1 mg/dL (ref 0.3–1.2)
Total Protein: 7.2 g/dL (ref 6.5–8.1)

## 2015-01-03 LAB — TROPONIN I

## 2015-01-03 MED ORDER — SORBITOL 70 % SOLN: 30.0000 mL | Freq: Every day | Status: DC | PRN

## 2015-01-03 MED ORDER — ACETAMINOPHEN 325 MG PO TABS: 650.0000 mg | ORAL_TABLET | Freq: Four times a day (QID) | ORAL | Status: DC | PRN

## 2015-01-03 MED ORDER — SODIUM CHLORIDE 0.9 % IV SOLN
INTRAVENOUS | Status: AC
  Administered 2015-01-03: 13:00:00 via INTRAVENOUS

## 2015-01-03 MED ORDER — ONDANSETRON HCL 4 MG/2ML IJ SOLN: 4.0000 mg | Freq: Four times a day (QID) | INTRAMUSCULAR | Status: DC | PRN

## 2015-01-03 MED ORDER — INFLUENZA VAC SPLIT QUAD 0.5 ML IM SUSY: 0.5000 mL | PREFILLED_SYRINGE | INTRAMUSCULAR | Status: DC

## 2015-01-03 MED ORDER — IOHEXOL 300 MG/ML  SOLN: 100.0000 mL | Freq: Once | INTRAMUSCULAR | Status: DC | PRN

## 2015-01-03 MED ORDER — ALUM & MAG HYDROXIDE-SIMETH 200-200-20 MG/5ML PO SUSP: 30.0000 mL | Freq: Four times a day (QID) | ORAL | Status: DC | PRN

## 2015-01-03 MED ORDER — IOHEXOL 300 MG/ML  SOLN
100.0000 mL | Freq: Once | INTRAMUSCULAR | Status: AC | PRN
  Administered 2015-01-03: 100 mL via INTRAVENOUS

## 2015-01-03 MED ORDER — ASPIRIN EC 81 MG PO TBEC
81.0000 mg | DELAYED_RELEASE_TABLET | Freq: Every day | ORAL | Status: DC
  Administered 2015-01-04: 81 mg via ORAL
  Filled 2015-01-03: qty 1

## 2015-01-03 MED ORDER — ACETAMINOPHEN 650 MG RE SUPP: 650.0000 mg | Freq: Four times a day (QID) | RECTAL | Status: DC | PRN

## 2015-01-03 MED ORDER — HYDROCODONE-ACETAMINOPHEN 5-325 MG PO TABS: 1.0000 | ORAL_TABLET | ORAL | Status: DC | PRN

## 2015-01-03 MED ORDER — NITROGLYCERIN 0.4 MG SL SUBL: 0.4000 mg | SUBLINGUAL_TABLET | SUBLINGUAL | Status: DC | PRN

## 2015-01-03 MED ORDER — RIVAROXABAN 15 MG PO TABS
15.0000 mg | ORAL_TABLET | ORAL | Status: AC
  Administered 2015-01-03: 15 mg via ORAL
  Filled 2015-01-03: qty 1

## 2015-01-03 MED ORDER — ENOXAPARIN SODIUM 30 MG/0.3ML ~~LOC~~ SOLN: 30.0000 mg | SUBCUTANEOUS | Status: DC

## 2015-01-03 MED ORDER — MORPHINE SULFATE (PF) 4 MG/ML IV SOLN
4.0000 mg | Freq: Once | INTRAVENOUS | Status: AC
  Administered 2015-01-03: 4 mg via INTRAVENOUS
  Filled 2015-01-03: qty 1

## 2015-01-03 MED ORDER — PNEUMOCOCCAL VAC POLYVALENT 25 MCG/0.5ML IJ INJ
0.5000 mL | INJECTION | INTRAMUSCULAR | Status: DC
  Filled 2015-01-03: qty 0.5

## 2015-01-03 MED ORDER — SENNOSIDES-DOCUSATE SODIUM 8.6-50 MG PO TABS
1.0000 | ORAL_TABLET | Freq: Every evening | ORAL | Status: DC | PRN
Start: 2015-01-03 — End: 2015-01-04

## 2015-01-03 MED ORDER — FLEET ENEMA 7-19 GM/118ML RE ENEM: 1.0000 | ENEMA | Freq: Once | RECTAL | Status: DC | PRN

## 2015-01-03 MED ORDER — IOHEXOL 350 MG/ML SOLN
100.0000 mL | Freq: Once | INTRAVENOUS | Status: AC | PRN
  Administered 2015-01-03: 80 mL via INTRAVENOUS

## 2015-01-03 MED ORDER — RIVAROXABAN 20 MG PO TABS: 20.0000 mg | ORAL_TABLET | Freq: Every day | ORAL | Status: DC

## 2015-01-03 MED ORDER — RIVAROXABAN 15 MG PO TABS
15.0000 mg | ORAL_TABLET | Freq: Two times a day (BID) | ORAL | Status: DC
  Administered 2015-01-03 – 2015-01-04 (×2): 15 mg via ORAL
  Filled 2015-01-03 (×2): qty 1

## 2015-01-03 MED ORDER — DOCUSATE SODIUM 100 MG PO CAPS
100.0000 mg | ORAL_CAPSULE | Freq: Two times a day (BID) | ORAL | Status: DC
  Administered 2015-01-03 – 2015-01-04 (×3): 100 mg via ORAL
  Filled 2015-01-03 (×3): qty 1

## 2015-01-03 MED ORDER — PROPRANOLOL HCL 20 MG PO TABS
60.0000 mg | ORAL_TABLET | Freq: Every day | ORAL | Status: DC
  Administered 2015-01-04: 60 mg via ORAL
  Filled 2015-01-03: qty 3

## 2015-01-03 MED ORDER — ONDANSETRON HCL 4 MG PO TABS: 4.0000 mg | ORAL_TABLET | Freq: Four times a day (QID) | ORAL | Status: DC | PRN

## 2015-01-03 NOTE — ED Notes (Signed)
Pt states that he fell day before yesterday.  Has been having pain in his right lower ribs since.

## 2015-01-03 NOTE — ED Provider Notes (Signed)
Admit for large pulmonary embolism   Stephen Ferguson, MD 01/03/15 1122

## 2015-01-03 NOTE — ED Provider Notes (Signed)
CSN: [id number]     Arrival date & time 01/03/15  0631 History   First MD Initiated Contact with Patient 01/03/15 [phone]     Chief Complaint  Patient presents with  . Fall     (Consider location/radiation/quality/duration/timing/severity/associated sxs/prior Treatment) Patient is a 81 y.o. male presenting with fall. The history is provided by the patient.  Fall  He was working in his yard 2 days ago of and lost his balance as he was trying to sit down. He rolled onto his side and is complaining of pain in his right lower chest since then. He states that it hurts on the inside but doesn't hurt to press on it. He was unable to sleep because of pain. He took acetaminophen without relief. Denies other injury.  Past Medical History  Diagnosis Date  . Mitral regurgitation     mild...echo.Marland KitchenMarland KitchenFebruary 2010  . Hyperlipemia   . Hypertension   . Atrioventricular block, type II     s/p PPM  . Atrial flutter     s/p CTI ablation October 2010 by Dr Caryl Comes  . CAD (coronary artery disease)     s/p CABG  . Ejection fraction     45%..echo.Marland KitchenMarland KitchenFebruary,2010/ EF 60%....TEE.Marland KitchenMarland KitchenOctober,2010  . PFO (patent foramen ovale)     small....TEE....October,2010  . Anticoagulant long-term use     coumadin therapy(in frequent mode switches February,2012)  . Hx of CABG   . Pacemaker    Past Surgical History  Procedure Laterality Date  . Cardiac pacemaker placement  06/21/2008    St. Jude dual-chamber permanent pacemaker  . Coronary artery bypass graft  06/17/2008    5 vessel  . Inguinal hernia repair      RIGHT  . Left lower extremity repair      from trauma in the distant past secondary to a fall  . Back surgery    . Total hip arthroplasty    . Total knee arthroplasty    . Atrial ablation surgery  2010    CTI ablation for atrial flutter by Dr Caryl Comes 2010   No family history on file. Social History  Substance Use Topics  . Smoking status: Never Smoker   . Smokeless tobacco: Never Used  . Alcohol Use:  No    Review of Systems  All other systems reviewed and are negative.     Allergies  Rosuvastatin  Home Medications   Prior to Admission medications   Medication Sig Start Date End Date Taking? Authorizing Provider  aspirin 325 MG tablet Take 325 mg by mouth daily.     Yes Historical Provider, MD  docusate sodium (COLACE) 100 MG capsule Take 100 mg by mouth 2 (two) times daily.     Yes Historical Provider, MD  nitroGLYCERIN (NITROSTAT) 0.4 MG SL tablet Place 1 tablet (0.4 mg total) under the tongue every 5 (five) minutes as needed for chest pain. 08/28/13  Yes Carlena Bjornstad, MD  propranolol (INDERAL) 40 MG tablet Take 60 mg by mouth daily. States he increased himself due to swelling in his ankles.   Yes Historical Provider, MD   BP 146/72 mmHg  Pulse 70  Temp(Src) 98 F (36.7 C) (Oral)  Resp 24  SpO2 97% Physical Exam  Nursing note and vitals reviewed.  81 year old male, resting comfortably and in no acute distress. Vital signs are significant for mild hypertension and mild tachypnea. Oxygen saturation is 97%, which is normal. Head is normocephalic and atraumatic. PERRLA, EOMI. Oropharynx is clear. Neck is nontender  and supple without adenopathy or JVD. Back is nontender and there is no CVA tenderness. Lungs are clear without rales, wheezes, or rhonchi. Chest is nontender. Heart has regular rate and rhythm without murmur. Abdomen is soft, flat, with moderate tenderness in the right subcostal area. There is absolutely no tenderness to palpation over the ribs. There are no masses or hepatosplenomegaly and peristalsis is normoactive. Extremities have no cyanosis or edema, full range of motion is present. Skin is warm and dry without rash. Neurologic: Mental status is normal, cranial nerves are intact, there are no motor or sensory deficits.  ED Course  Procedures (including critical care time) Labs Review Results for orders placed or performed during the hospital encounter of  01/03/15  CBC with Differential  Result Value Ref Range   WBC 10.8 (H) 4.0 - 10.5 K/uL   RBC 3.96 (L) 4.22 - 5.81 MIL/uL   Hemoglobin 13.0 13.0 - 17.0 g/dL   HCT 38.4 (L) 39.0 - 52.0 %   MCV 97.0 78.0 - 100.0 fL   MCH 32.8 26.0 - 34.0 pg   MCHC 33.9 30.0 - 36.0 g/dL   RDW 13.2 11.5 - 15.5 %   Platelets 150 150 - 400 K/uL   Neutrophils Relative % 76 43 - 77 %   Neutro Abs 8.2 (H) 1.7 - 7.7 K/uL   Lymphocytes Relative 13 12 - 46 %   Lymphs Abs 1.4 0.7 - 4.0 K/uL   Monocytes Relative 10 3 - 12 %   Monocytes Absolute 1.1 (H) 0.1 - 1.0 K/uL   Eosinophils Relative 1 0 - 5 %   Eosinophils Absolute 0.1 0.0 - 0.7 K/uL   Basophils Relative 0 0 - 1 %   Basophils Absolute 0.0 0.0 - 0.1 K/uL   I have personally reviewed and evaluated these lab results as part of my medical decision-making.  MDM   Final diagnoses:  Fall at home, initial encounter  Abdominal pain, right upper quadrant    Fall with pain in the right upper abdomen. I have relatively low index of suspicion of significant injury, but will send for CT to make sure there is no liver injury.  CBC shows normal hemoglobin and no drop from prior hemoglobins. Metabolic panel is pending as is CT of abdomen and pelvis. Case is signed out to Dr. Dewayne Hatch.  Delora Fuel, MD 24/09/73 [id number]

## 2015-01-03 NOTE — H&P (Signed)
[facility] History and Physical  Stephen Olsen ZOX:[id number] DOB: [birth year] DOA: 01/03/2015  Referring physician: [facility] PCP: Glenda Chroman., MD   Chief Complaint: right sided chest pain/sob  HPI: Stephen Olsen is a delightful 81 y.o. 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 Initial evaluation in the emergency department reveals extensive pulmonary embolus on the right.  Information is obtained from the patient reports 2 days ago he lost his balance when he was trying to sit on the ground and "my legs wouldn't bend down far enough and I hit the ground and rolled onto my side". He reports no pain discomfort until the next morning when he was "so sore he can get out of bed". Pain located in right anterior chest and radiated somewhat to the right side and back. Describes this pain as a ache worse with movement and deep breathing coughing and touch. He took a total of 6 Tylenol over the 2 days with little relief. Yesterday this pain worsened to the point that he was unable to lie down or to sleep. He reports he felt somewhat short of breath due to pain but did not feel like he was "breathless". He denies headache dizziness visual disturbances syncope or near-syncope. He denies abdominal pain nausea vomiting diarrhea dysuria hematuria frequency or urgency.  Workup in the emergency department includes CBC significant for WBCs 81.1, metabolic panel significant for BUN of 24, calcium 8.6 glucose 110. CT abdomen pelvis report suspicious for venous thromboembolic disease and concern for PE on the right lobe. CT of the chest confirmed. EKG Sinus rhythm Short PR intervalvNonspecific IVCD with LAD  In the emergency department he is afebrile hemodynamically stable and not hypoxic. He  is given 4 mg of morphine for pain while in the emergency department  Review of Systems:  10 point review of systems complete and all systems are negative except as indicated in the history of present illness Past Medical History  Diagnosis Date  . Mitral regurgitation     mild...echo.Marland KitchenMarland KitchenFebruary 2010  . Hyperlipemia   . Hypertension   . Atrioventricular block, type II     s/p PPM  . Atrial flutter     s/p CTI ablation October 2010 by Dr Caryl Comes  . CAD (coronary artery disease)     s/p CABG  . Ejection fraction     45%..echo.Marland KitchenMarland KitchenFebruary,2010/ EF 60%....TEE.Marland KitchenMarland KitchenOctober,2010  . PFO (patent foramen ovale)     small....TEE....October,2010  . Anticoagulant long-term use     coumadin therapy(in frequent mode switches February,2012)  . Hx of CABG   . Pacemaker   . Pulmonary emboli   . BPH (benign prostatic hyperplasia)    Past Surgical History  Procedure Laterality Date  . Cardiac pacemaker placement  06/21/2008    St. Jude dual-chamber permanent pacemaker  . Coronary artery bypass graft  06/17/2008    5 vessel  . Inguinal hernia repair      RIGHT  . Left lower extremity repair      from trauma in the distant past secondary to a fall  . Back surgery    . Total hip arthroplasty    . Total knee arthroplasty    . Atrial ablation surgery  2010    CTI ablation for atrial flutter by Dr Caryl Comes 2010   Social History:  reports that  he has never smoked. He has never used smokeless tobacco. He reports that he does not drink alcohol or use illicit drugs. Patient is a widow he lives alone he's quite independent with ADLs able to cook and clean and transport himself. He is a retired Administrator. Allergies  Allergen Reactions  . Rosuvastatin     REACTION: blood pressure    No family history on file. family medical history reviewed and noncontributory to the admission of this elderly gentleman  Prior to Admission medications   Medication Sig Start Date End Date Taking? Authorizing Provider    aspirin 325 MG tablet Take 325 mg by mouth daily.     Yes Historical Provider, MD  docusate sodium (COLACE) 100 MG capsule Take 100 mg by mouth 2 (two) times daily.     Yes Historical Provider, MD  nitroGLYCERIN (NITROSTAT) 0.4 MG SL tablet Place 1 tablet (0.4 mg total) under the tongue every 5 (five) minutes as needed for chest pain. 08/28/13  Yes Carlena Bjornstad, MD  propranolol (INDERAL) 40 MG tablet Take 60 mg by mouth daily. States he increased himself due to swelling in his ankles.   Yes Historical Provider, MD   Physical Exam: Filed Vitals:   01/03/15 0930 01/03/15 1030 01/03/15 1130 01/03/15 1214  BP: 111/65 104/57 106/65 133/59  Pulse: 74 70 74 70  Temp:    97.8 F (36.6 C)  TempSrc:    Oral  Resp:   18 18  Height:    5\' 10"  (1.778 m)  SpO2: 95% 95% 95% 98%    Wt Readings from Last 3 Encounters:  09/11/14 85.458 kg (188 lb 6.4 oz)  03/11/14 82.555 kg (182 lb)  09/20/13 83.516 kg (184 lb 1.9 oz)    General:  Appears calm and comfortable Eyes: PERRL, normal lids, irises & conjunctiva ENT: grossly normal hearing, weakness membranes of his mouth slightly pale very dry lips slightly bluish Neck: no LAD, masses or thyromegaly Cardiovascular: RRR, no m/r/g. Right-sided chest tender to palpation Right leg with what appears to be chronic swelling no erythema or tenderness Respiratory: CTA bilaterally, no w/r/r. Normal respiratory effort. Abdomen: soft, ntnd positive bowel sounds throughout Skin: no rash or induration seen on limited exam Musculoskeletal: grossly normal tone BUE/BLE Psychiatric: grossly normal mood and affect, speech fluent and appropriate Neurologic: grossly non-focal.          Labs on Admission:  Basic Metabolic Panel:  Recent Labs Lab 01/03/15 0717  NA 137  K 4.4  CL 105  CO2 26  GLUCOSE 110*  BUN 24*  CREATININE 1.12  CALCIUM 8.6*   Liver Function Tests:  Recent Labs Lab 01/03/15 0717  AST 14*  ALT 11*  ALKPHOS 71  BILITOT 1.1  PROT 7.2   ALBUMIN 3.7   No results for input(s): LIPASE, AMYLASE in the last 168 hours. No results for input(s): AMMONIA in the last 168 hours. CBC:  Recent Labs Lab 01/03/15 0717  WBC 10.8*  NEUTROABS 8.2*  HGB 13.0  HCT 38.4*  MCV 97.0  PLT 150   Cardiac Enzymes: No results for input(s): CKTOTAL, CKMB, CKMBINDEX, TROPONINI in the last 168 hours.  BNP (last 3 results) No results for input(s): BNP in the last 8760 hours.  ProBNP (last 3 results) No results for input(s): PROBNP in the last 8760 hours.  CBG: No results for input(s): GLUCAP in the last 168 hours.  Radiological Exams on Admission: Ct Angio Chest Pe W/cm &/or Wo Cm  01/03/2015  CLINICAL DATA:  Pulmonary embolus seen on CT abdomen and pelvis earlier today. Recent trauma. Chest pain.  EXAM: CT ANGIOGRAPHY CHEST WITH CONTRAST  TECHNIQUE: Multidetector CT imaging of the chest was performed using the standard protocol during bolus administration of intravenous contrast. Multiplanar CT image reconstructions and MIPs were obtained to evaluate the vascular anatomy.  CONTRAST:  80mL OMNIPAQUE IOHEXOL 350 MG/ML SOLN  COMPARISON:  CT abdomen and pelvis performed earlier in the day  FINDINGS: There is pulmonary embolus arising from the distal main right pulmonary artery with extension into multiple right upper and lower lobe pulmonary artery branches. There are a few small incompletely obstructing left lower lobe pulmonary emboli. The right ventricle to left ventricle diameter ratio is 0.8, a value that does not meet criteria for right heart strain.  There is no appreciable thoracic aortic aneurysm or dissection. Visualized great vessels appear mildly tortuous with mild atherosclerotic change. No hemodynamically significant obstruction is seen in these areas.  Pacemaker leads are attached to the right atrium and right ventricle. The pericardium is not thickened. There are foci of coronary artery calcification.  There is patchy bibasilar lung  atelectasis with small right effusion. There is a small area of consolidation in the anterior segment left lower lobe.  Thyroid is normal.  No thoracic adenopathy.  There is degenerative change in the thoracic spine. There is arthropathy in the left shoulder, extensive. No blastic or lytic bone lesions.  Review of the MIP images confirms the above findings.  IMPRESSION: Extensive pulmonary embolus on the right. Small incompletely obstructing pulmonary emboli on the left. No right heart strain.  Small area of consolidation anterior segment left lower lobe. Patchy atelectasis right base with small right effusion.  No appreciable thoracic adenopathy. There are foci of coronary artery calcification. Pacemaker leads are attached to the right atrium and right ventricle.  Critical Value/emergent results were called by telephone at the time of interpretation on 01/03/2015 at 10:39 am to Dr. Milton Ferguson , who verbally acknowledged these results.   Electronically Signed   By: Lowella Grip III M.D.   On: 01/03/2015 10:42   Ct Abdomen Pelvis W Contrast  01/03/2015   CLINICAL DATA:  Fall with right anterior chest pain.  EXAM: CT ABDOMEN AND PELVIS WITH CONTRAST  TECHNIQUE: Multidetector CT imaging of the abdomen and pelvis was performed using the standard protocol following bolus administration of intravenous contrast.  CONTRAST:  163mL OMNIPAQUE IOHEXOL 300 MG/ML  SOLN  COMPARISON:  02/20/2011 report, images not available.  FINDINGS: Suspicion for filling defects in the right lower lobe pulmonary arteries. Findings are concerning for a pulmonary embolism. There is a trace amount of right pleural fluid. Pacemaker leads in the heart. No significant pericardial fluid. Mild volume loss in the right lower lobe.  Negative for free intraperitoneal air. 8 mm low-density structure in the right hepatic lobe is too small to definitively characterize. No acute abnormality involving the liver or gallbladder. Portal venous system is  patent. No acute abnormality involving the liver, pancreas or adrenal glands. Evidence for bilateral renal cysts. Prominent left parapelvic cysts. No definite hydronephrosis. Normal appearance of stomach and duodenum.  Atherosclerotic calcifications involving the abdominal aorta without aneurysm.  The prostate is markedly enlarged measuring 7.6 cm in the transverse dimension on sequence 3, image 80. Central prostate nodularity protrudes into the base of the bladder. Urinary bladder is distended and there is evidence for bladder diverticula.  There is concern for a filling defect in the central aspect  of the left common femoral vein. This could represent a deep vein thrombosis. No significant free fluid or lymphadenopathy.  Degenerative changes in both hips. Multilevel degenerative facet and disc disease in lumbar spine.  IMPRESSION: High suspicion for venous thromboembolic disease. There is concern for pulmonary emboli in the right lower lobe and concern for deep vein thrombosis in the left common femoral vein. Recommend dedicated imaging of the chest with a CTA examination. In addition or alternately, lower extremity venous duplex could be performed.  No acute abnormality in the abdomen or pelvis.  Bilateral renal cysts.  Prostate hypertrophy with multiple bladder diverticula. Findings suggest chronic bladder outlet obstruction.  Critical Value/emergent results were called by telephone at the time of interpretation on 01/03/2015 at 9:01 am to Dr. Roderic Palau , who verbally acknowledged these results.   Electronically Signed   By: Markus Daft M.D.   On: 01/03/2015 09:12    EKG: Independently reviewed. EKG as above  Assessment/Plan Principal Problem:   Pulmonary embolism: Likely venous thromboembolic disease in patient with history of prostate cancer. Will admit to medical bed. Will start xarelto per pharmacy. Of note patient refused anticoagulation in the past when he had episode of A. fib per cardiology note.  Discussed with patient and he agreed. Active Problems: Chest pain: Right side. Likely related to mechanical fall 2 days ago. Analgesia as needed.    CAD, NATIVE VESSEL: Chart review indicates cardiology seen in May of this year. Note indicates he underwent bypass surgery in 2010 and has not had any significant chest pain or shortness of breath since that time. He also noted he had only rare atrial fib and he refused anticoagulation. His aspirin was changed 81 mg according to office note    Paroxysmal atrial fibrillation: See above.      Hyperlipemia: Obtain a lipid panel    Hypertension: Controlled continue home medicines      Code Status: full DVT Prophylaxis: Family Communication: daughter at bedside Disposition Plan: home hopefully 24 hours  Time spent: 36 minutes  [facility] Pager [phone]

## 2015-01-03 NOTE — ED Notes (Signed)
MD at the bedside to talk with pt and family

## 2015-01-03 NOTE — Plan of Care (Signed)
PT met with patient and noted he lives by self and when bent down to pick something up he fell.  PT also not comfortable with ambulation yet, since patient not fully coagulated yet.  Currently, PT feels patient is stable and will not need PT services. If staff notes otherwise - please place new consult for 01/04/15.

## 2015-01-03 NOTE — Progress Notes (Signed)
PT Cancellation Note  Patient Details Name: Stephen Olsen MRN: [id number] DOB: [birth year]   Cancelled Treatment:    Reason Eval/Treat Not Completed: PT screened, no needs identified, will sign off.  I have spoken with RN and asked him to ambulate pt when pt is fully anticoagulated.  If there is any instability noted I would be happy to be reconsulted.  From speaking with the pt this was truly a fluke fall that he had when he thought that he could get into a squat position and actually couldn't.   Demetrios Isaacs L  PT 01/03/2015, 2:39 PM [phone]

## 2015-01-03 NOTE — Care Management Note (Signed)
Case Management Note  Patient Details  Name: Stephen Olsen MRN: [id number] Date of Birth: 27-Apr-1924  Expected Discharge Date:                  Expected Discharge Plan:  Home/Self Care  In-House Referral:  NA  Discharge planning Services  CM Consult  Post Acute Care Choice:  NA Choice offered to:  NA  DME Arranged:    DME Agency:     HH Arranged:    [place] Agency:     Status of Service:  Completed, signed off  Medicare Important Message Given:    Date Medicare IM Given:    Medicare IM give by:    Date Additional Medicare IM Given:    Additional Medicare Important Message give by:     If discussed at [place] of Stay Meetings, dates discussed:    Additional Comments: Pt admitted for blood clot, CM asked to see for initiation of xeralto. Pt has no drug coverage. Given 30 day free voucher and instructed pt's daughter to call drug company to apply for assistance. Daughter verbalized understanding and thankful for assistance. Pt lives alone and is usually ind at baseline. Pt feels he is at his baseline now but daughter plans for him to come stay with her until feeling better. No further CM needs identified through our conversation.  Sherald Barge, RN 01/03/2015, 4:01 PM

## 2015-01-03 NOTE — Discharge Instructions (Signed)
Information on my medicine - XARELTO (rivaroxaban)  This medication education was reviewed with me or my healthcare representative as part of my discharge preparation.  The pharmacist that spoke with me during my hospital stay was:  Ena Dawley, [place]? Xarelto was prescribed to treat blood clots that may have been found in the veins of your legs (deep vein thrombosis) or in your lungs (pulmonary embolism) and to reduce the risk of them occurring again.  What do you need to know about Xarelto? The starting dose is one 15 mg tablet taken TWICE daily with food for the FIRST 21 DAYS then on (enter date)  01/24/15  the dose is changed to one 20 mg tablet taken ONCE A DAY with your evening meal.  DO NOT stop taking Xarelto without talking to the health care provider who prescribed the medication.  Refill your prescription for 20 mg tablets before you run out.  After discharge, you should have regular check-up appointments with your healthcare provider that is prescribing your Xarelto.  In the future your dose may need to be changed if your kidney function changes by a significant amount.  What do you do if you miss a dose? If you are taking Xarelto TWICE DAILY and you miss a dose, take it as soon as you remember. You may take two 15 mg tablets (total 30 mg) at the same time then resume your regularly scheduled 15 mg twice daily the next day.  If you are taking Xarelto ONCE DAILY and you miss a dose, take it as soon as you remember on the same day then continue your regularly scheduled once daily regimen the next day. Do not take two doses of Xarelto at the same time.   Important Safety Information Xarelto is a blood thinner medicine that can cause bleeding. You should call your healthcare provider right away if you experience any of the following: ? Bleeding from an injury or your nose that does not stop. ? Unusual colored urine (red or dark brown) or  unusual colored stools (red or black). ? Unusual bruising for unknown reasons. ? A serious fall or if you hit your head (even if there is no bleeding).  Some medicines may interact with Xarelto and might increase your risk of bleeding while on Xarelto. To help avoid this, consult your healthcare provider or pharmacist prior to using any new prescription or non-prescription medications, including herbals, vitamins, non-steroidal anti-inflammatory drugs (NSAIDs) and supplements.  This website has more information on Xarelto: [email or web address]

## 2015-01-03 NOTE — Progress Notes (Signed)
ANTICOAGULATION CONSULT NOTE - Initial Consult  Pharmacy Consult for Xarelto Indication: pulmonary embolus  Allergies  Allergen Reactions  . Rosuvastatin     REACTION: blood pressure   Patient Measurements: Height: 5\' 10"  (177.8 cm) IBW/kg (Calculated) : 73  Vital Signs: Temp: 97.8 F (36.6 C) (09/09 1214) Temp Source: Oral (09/09 1214) BP: 133/59 mmHg (09/09 1214) Pulse Rate: 70 (09/09 1214)  Labs:  Recent Labs  01/03/15 0717  HGB 13.0  HCT 38.4*  PLT 150  CREATININE 1.12   CrCl cannot be calculated (Unknown ideal weight.).  Medical History: Past Medical History  Diagnosis Date  . Mitral regurgitation     mild...echo.Marland KitchenMarland KitchenFebruary 2010  . Hyperlipemia   . Hypertension   . Atrioventricular block, type II     s/p PPM  . Atrial flutter     s/p CTI ablation October 2010 by Dr Caryl Comes  . CAD (coronary artery disease)     s/p CABG  . Ejection fraction     45%..echo.Marland KitchenMarland KitchenFebruary,2010/ EF 60%....TEE.Marland KitchenMarland KitchenOctober,2010  . PFO (patent foramen ovale)     small....TEE....October,2010  . Anticoagulant long-term use     coumadin therapy(in frequent mode switches February,2012)  . Hx of CABG   . Pacemaker   . Pulmonary emboli   . BPH (benign prostatic hyperplasia)   . Prostate cancer    Medications:  Prescriptions prior to admission  Medication Sig Dispense Refill Last Dose  . aspirin 325 MG tablet Take 325 mg by mouth daily.     01/03/2015 at Unknown time  . docusate sodium (COLACE) 100 MG capsule Take 100 mg by mouth 2 (two) times daily.     01/03/2015 at Unknown time  . nitroGLYCERIN (NITROSTAT) 0.4 MG SL tablet Place 1 tablet (0.4 mg total) under the tongue every 5 (five) minutes as needed for chest pain. 25 tablet 3 unknown  . propranolol (INDERAL) 40 MG tablet Take 60 mg by mouth daily. States he increased himself due to swelling in his ankles.   01/03/2015 at  0530   Assessment: 81yo male presents with worsening right-sided chest pain.  Evaluation in ED reveals extensive  PE.  Asked to initiate Xarelto.    Goal of Therapy:  Full dose anticoagulation with Xarelto Monitor platelets by anticoagulation protocol: Yes   Plan:  Xarelto 15mg  PO BID x 3 weeks then Xarelto 20mg  po daily with supper thereafter Monitor CBC, s/sx of bleeding complications Provide patient education  Hart Robinsons A 01/03/2015,12:50 PM

## 2015-01-04 DIAGNOSIS — S3991XA Unspecified injury of abdomen, initial encounter: Secondary | ICD-10-CM | POA: Diagnosis not present

## 2015-01-04 DIAGNOSIS — I2699 Other pulmonary embolism without acute cor pulmonale: Secondary | ICD-10-CM | POA: Diagnosis not present

## 2015-01-04 LAB — CBC
HEMATOCRIT: 36.3 % — AB (ref 39.0–52.0)
HEMOGLOBIN: 12.1 g/dL — AB (ref 13.0–17.0)
MCH: 32.4 pg (ref 26.0–34.0)
MCHC: 33.3 g/dL (ref 30.0–36.0)
MCV: 97.3 fL (ref 78.0–100.0)
Platelets: 156 10*3/uL (ref 150–400)
RBC: 3.73 MIL/uL — ABNORMAL LOW (ref 4.22–5.81)
RDW: 13.2 % (ref 11.5–15.5)
WBC: 9.1 10*3/uL (ref 4.0–10.5)

## 2015-01-04 LAB — BASIC METABOLIC PANEL
ANION GAP: 7 (ref 5–15)
BUN: 18 mg/dL (ref 6–20)
CALCIUM: 8.2 mg/dL — AB (ref 8.9–10.3)
CO2: 26 mmol/L (ref 22–32)
Chloride: 105 mmol/L (ref 101–111)
Creatinine, Ser: 0.96 mg/dL (ref 0.61–1.24)
GFR calc Af Amer: >60 mL/min (ref >60)
GFR calc non Af Amer: >60 mL/min (ref >60)
GLUCOSE: 99 mg/dL (ref 65–99)
POTASSIUM: 4 mmol/L (ref 3.5–5.1)
Sodium: 138 mmol/L (ref 135–145)

## 2015-01-04 LAB — TROPONIN I: Troponin I: <0.03 ng/mL (ref <0.031)

## 2015-01-04 MED ORDER — RIVAROXABAN 15 MG PO TABS: 15.0000 mg | ORAL_TABLET | Freq: Two times a day (BID) | ORAL | Status: DC

## 2015-01-04 MED ORDER — HYDROCODONE-ACETAMINOPHEN 5-325 MG PO TABS: 1.0000 | ORAL_TABLET | ORAL | Status: DC | PRN

## 2015-01-04 MED ORDER — RIVAROXABAN 20 MG PO TABS: 20.0000 mg | ORAL_TABLET | Freq: Every day | ORAL | Status: DC

## 2015-01-04 NOTE — Progress Notes (Signed)
1350 d/c paperwork, instructions and hard Rxs given to patient. IV catheter removed from LEFT AC, catheter intact, no s/s of infection noted, patient tolerated well w/no c/o pain or discomfort noted. Family at bedside, daughter to transport patient home. Staff assisted patient to vehicle via w/c.

## 2015-01-04 NOTE — Discharge Summary (Signed)
Physician Discharge Summary  Stephen Olsen CHY:[id number] DOB: [birth year] DOA: 01/03/2015  PCP: Glenda Chroman., MD  Admit date: 01/03/2015 Discharge date: 01/04/2015  Time spent: 45 minutes  Recommendations for Outpatient Follow-up:  -Will be discharged home today. -Advised to follow up with PCP in 2 weeks. -Has been started on Xarelto for treatment of his PE.   Discharge Diagnoses:  Principal Problem:   Pulmonary embolism Active Problems:   CAD, NATIVE VESSEL   Paroxysmal atrial fibrillation   Long term current use of anticoagulant   Hyperlipemia   Hypertension   Chest pain   Discharge Condition: Stable and improved  Filed Weights   01/03/15 1338  Weight: 83.553 kg (184 lb 3.2 oz)    History of present illness:  Stephen Olsen is a delightful 81 y.o. 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 Initial evaluation in the emergency department reveals extensive pulmonary embolus on the right.  Information is obtained from the patient reports 2 days ago he lost his balance when he was trying to sit on the ground and "my legs wouldn't bend down far enough and I hit the ground and rolled onto my side". He reports no pain discomfort until the next morning when he was "so sore he can get out of bed". Pain located in right anterior chest and radiated somewhat to the right side and back. Describes this pain as a ache worse with movement and deep breathing coughing and touch. He took a total of 6 Tylenol over the 2 days with little relief. Yesterday this pain worsened to the point that he was unable to lie down or to sleep. He reports he felt somewhat short of breath due to pain but did not feel like he was "breathless". He denies headache dizziness visual disturbances  syncope or near-syncope. He denies abdominal pain nausea vomiting diarrhea dysuria hematuria frequency or urgency.  Workup in the emergency department includes CBC significant for WBCs 87.8, metabolic panel significant for BUN of 24, calcium 8.6 glucose 110. CT abdomen pelvis report suspicious for venous thromboembolic disease and concern for PE on the right lobe. CT of the chest confirmed. EKG Sinus rhythm Short PR intervalvNonspecific IVCD with LAD  Hospital Course:   PE -Patient clinically doing well other than right chest wall pain with deep inspiration. No oxygen requirements. -Has been started on xarelto which he should continue for at least 6 weeks.  CAD -Stable.  HTN -Continue porpranolol.  Procedures:  None   Consultations:  None  Discharge Instructions  Discharge Instructions    Diet - low sodium heart healthy    Complete by:  As directed      Increase activity slowly    Complete by:  As directed             Medication List    STOP taking these medications        aspirin 325 MG tablet      TAKE these medications        docusate sodium 100 MG capsule  Commonly known as:  COLACE  Take 100 mg by mouth 2 (two) times daily.     HYDROcodone-acetaminophen 5-325 MG per tablet  Commonly known as:  NORCO/VICODIN  Take 1-2 tablets by mouth every 4 (four) hours as needed for moderate pain.  nitroGLYCERIN 0.4 MG SL tablet  Commonly known as:  NITROSTAT  Place 1 tablet (0.4 mg total) under the tongue every 5 (five) minutes as needed for chest pain.     propranolol 40 MG tablet  Commonly known as:  INDERAL  Take 60 mg by mouth daily. States he increased himself due to swelling in his ankles.     Rivaroxaban 15 MG Tabs tablet  Commonly known as:  XARELTO  Take 1 tablet (15 mg total) by mouth 2 (two) times daily with a meal.     rivaroxaban 20 MG Tabs tablet  Commonly known as:  XARELTO  Take 1 tablet (20 mg total) by mouth daily with supper.  Start  taking on:  01/24/2015       Allergies  Allergen Reactions  . Rosuvastatin     REACTION: blood pressure       Follow-up Information    Follow up with VYAS,DHRUV B., MD. Schedule an appointment as soon as possible for a visit in 2 weeks.   Specialty:  Internal Medicine   Contact information:   [place] [address] [phone]        The results of significant diagnostics from this hospitalization (including imaging, microbiology, ancillary and laboratory) are listed below for reference.    Significant Diagnostic Studies: Ct Angio Chest Pe W/cm &/or Wo Cm  01/03/2015   CLINICAL DATA:  Pulmonary embolus seen on CT abdomen and pelvis earlier today. Recent trauma. Chest pain.  EXAM: CT ANGIOGRAPHY CHEST WITH CONTRAST  TECHNIQUE: Multidetector CT imaging of the chest was performed using the standard protocol during bolus administration of intravenous contrast. Multiplanar CT image reconstructions and MIPs were obtained to evaluate the vascular anatomy.  CONTRAST:  90mL OMNIPAQUE IOHEXOL 350 MG/ML SOLN  COMPARISON:  CT abdomen and pelvis performed earlier in the day  FINDINGS: There is pulmonary embolus arising from the distal main right pulmonary artery with extension into multiple right upper and lower lobe pulmonary artery branches. There are a few small incompletely obstructing left lower lobe pulmonary emboli. The right ventricle to left ventricle diameter ratio is 0.8, a value that does not meet criteria for right heart strain.  There is no appreciable thoracic aortic aneurysm or dissection. Visualized great vessels appear mildly tortuous with mild atherosclerotic change. No hemodynamically significant obstruction is seen in these areas.  Pacemaker leads are attached to the right atrium and right ventricle. The pericardium is not thickened. There are foci of coronary artery calcification.  There is patchy bibasilar lung atelectasis with small right effusion. There is a small area of  consolidation in the anterior segment left lower lobe.  Thyroid is normal.  No thoracic adenopathy.  There is degenerative change in the thoracic spine. There is arthropathy in the left shoulder, extensive. No blastic or lytic bone lesions.  Review of the MIP images confirms the above findings.  IMPRESSION: Extensive pulmonary embolus on the right. Small incompletely obstructing pulmonary emboli on the left. No right heart strain.  Small area of consolidation anterior segment left lower lobe. Patchy atelectasis right base with small right effusion.  No appreciable thoracic adenopathy. There are foci of coronary artery calcification. Pacemaker leads are attached to the right atrium and right ventricle.  Critical Value/emergent results were called by telephone at the time of interpretation on 01/03/2015 at 10:39 am to Dr. Milton Ferguson , who verbally acknowledged these results.   Electronically Signed   By: Lowella Grip III M.D.  On: 01/03/2015 10:42   Ct Abdomen Pelvis W Contrast  01/03/2015   CLINICAL DATA:  Fall with right anterior chest pain.  EXAM: CT ABDOMEN AND PELVIS WITH CONTRAST  TECHNIQUE: Multidetector CT imaging of the abdomen and pelvis was performed using the standard protocol following bolus administration of intravenous contrast.  CONTRAST:  142mL OMNIPAQUE IOHEXOL 300 MG/ML  SOLN  COMPARISON:  02/20/2011 report, images not available.  FINDINGS: Suspicion for filling defects in the right lower lobe pulmonary arteries. Findings are concerning for a pulmonary embolism. There is a trace amount of right pleural fluid. Pacemaker leads in the heart. No significant pericardial fluid. Mild volume loss in the right lower lobe.  Negative for free intraperitoneal air. 8 mm low-density structure in the right hepatic lobe is too small to definitively characterize. No acute abnormality involving the liver or gallbladder. Portal venous system is patent. No acute abnormality involving the liver, pancreas or  adrenal glands. Evidence for bilateral renal cysts. Prominent left parapelvic cysts. No definite hydronephrosis. Normal appearance of stomach and duodenum.  Atherosclerotic calcifications involving the abdominal aorta without aneurysm.  The prostate is markedly enlarged measuring 7.6 cm in the transverse dimension on sequence 3, image 80. Central prostate nodularity protrudes into the base of the bladder. Urinary bladder is distended and there is evidence for bladder diverticula.  There is concern for a filling defect in the central aspect of the left common femoral vein. This could represent a deep vein thrombosis. No significant free fluid or lymphadenopathy.  Degenerative changes in both hips. Multilevel degenerative facet and disc disease in lumbar spine.  IMPRESSION: High suspicion for venous thromboembolic disease. There is concern for pulmonary emboli in the right lower lobe and concern for deep vein thrombosis in the left common femoral vein. Recommend dedicated imaging of the chest with a CTA examination. In addition or alternately, lower extremity venous duplex could be performed.  No acute abnormality in the abdomen or pelvis.  Bilateral renal cysts.  Prostate hypertrophy with multiple bladder diverticula. Findings suggest chronic bladder outlet obstruction.  Critical Value/emergent results were called by telephone at the time of interpretation on 01/03/2015 at 9:01 am to Dr. Roderic Palau , who verbally acknowledged these results.   Electronically Signed   By: Markus Daft M.D.   On: 01/03/2015 09:12    Microbiology: No results found for this or any previous visit (from the past 240 hour(s)).   Labs: Basic Metabolic Panel:  Recent Labs Lab 01/03/15 0717 01/04/15 0611  NA 137 138  K 4.4 4.0  CL 105 105  CO2 26 26  GLUCOSE 110* 99  BUN 24* 18  CREATININE 1.12 0.96  CALCIUM 8.6* 8.2*   Liver Function Tests:  Recent Labs Lab 01/03/15 0717  AST 14*  ALT 11*  ALKPHOS 71  BILITOT 1.1  PROT  7.2  ALBUMIN 3.7   No results for input(s): LIPASE, AMYLASE in the last 168 hours. No results for input(s): AMMONIA in the last 168 hours. CBC:  Recent Labs Lab 01/03/15 0717 01/04/15 0611  WBC 10.8* 9.1  NEUTROABS 8.2*  --   HGB 13.0 12.1*  HCT 38.4* 36.3*  MCV 97.0 97.3  PLT 150 156   Cardiac Enzymes:  Recent Labs Lab 01/03/15 0717 01/04/15 0611  TROPONINI <0.03 <0.03   BNP: BNP (last 3 results) No results for input(s): BNP in the last 8760 hours.  ProBNP (last 3 results) No results for input(s): PROBNP in the last 8760 hours.  CBG: No results for  input(s): GLUCAP in the last 168 hours.     SignedLelon Frohlich  [facility] Pager: [phone] 01/04/2015, 12:29 PM

## 2015-01-20 DIAGNOSIS — I441 Atrioventricular block, second degree: Secondary | ICD-10-CM | POA: Diagnosis not present

## 2015-03-28 ENCOUNTER — Ambulatory Visit (INDEPENDENT_AMBULATORY_CARE_PROVIDER_SITE_OTHER): Payer: Medicare Other | Admitting: Internal Medicine

## 2015-03-28 ENCOUNTER — Encounter: Payer: Self-pay | Admitting: Internal Medicine

## 2015-03-28 VITALS — BP 148/88 | HR 70 | Ht 70.0 in | Wt 188.0 lb

## 2015-03-28 DIAGNOSIS — I25810 Atherosclerosis of coronary artery bypass graft(s) without angina pectoris: Secondary | ICD-10-CM | POA: Diagnosis not present

## 2015-03-28 DIAGNOSIS — I441 Atrioventricular block, second degree: Secondary | ICD-10-CM | POA: Diagnosis not present

## 2015-03-28 DIAGNOSIS — I480 Paroxysmal atrial fibrillation: Secondary | ICD-10-CM

## 2015-03-28 DIAGNOSIS — Z950 Presence of cardiac pacemaker: Secondary | ICD-10-CM

## 2015-03-28 NOTE — Progress Notes (Signed)
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  Since last being seen in our clinic, the patient reports doing very well.  He has recently been diagnosed with PTE and is now on xarelto.  He is tolerating this without difficulty.  Today, he denies symptoms of palpitations, chest pain, shortness of breath,  lower extremity edema, dizziness, presyncope, or syncope.  The patient is otherwise without complaint today.   Past Medical History  Diagnosis Date  . Mitral regurgitation     mild...echo.Marland KitchenMarland KitchenFebruary 2010  . Hyperlipemia   . Hypertension   . Atrioventricular block, type II     s/p PPM  . Atrial flutter [facility])     s/p CTI ablation October 2010 by Dr Caryl Comes  . CAD (coronary artery disease)     s/p CABG  . Ejection fraction     45%..echo.Marland KitchenMarland KitchenFebruary,2010/ EF 60%....TEE.Marland KitchenMarland KitchenOctober,2010  . PFO (patent foramen ovale)     small....TEE....October,2010  . Hx of CABG   . Pacemaker   . Pulmonary emboli (HCC)     on xarelto  . BPH (benign prostatic hyperplasia)   . Prostate cancer ([place])   . Paroxysmal atrial fibrillation [facility])    Past Surgical History  Procedure Laterality Date  . Cardiac pacemaker placement  06/21/2008    St. Jude dual-chamber permanent pacemaker  . Coronary artery bypass graft  06/17/2008    5 vessel  . Inguinal hernia repair      RIGHT  . Left lower extremity repair      from trauma in the distant past secondary to a fall  . Back surgery    . Total hip arthroplasty    . Total knee arthroplasty    . Atrial ablation surgery  2010    CTI ablation for atrial flutter by Dr Caryl Comes 2010    Current Outpatient Prescriptions  Medication Sig Dispense Refill  . docusate sodium (COLACE) 100 MG capsule Take 100 mg by mouth 2 (two) times daily.      [name] HYDROcodone-acetaminophen (NORCO/VICODIN) 5-325 MG per tablet Take 1-2 tablets by mouth every 4 (four) hours as needed for moderate  pain. 20 tablet 0  . nitroGLYCERIN (NITROSTAT) 0.4 MG SL tablet Place 1 tablet (0.4 mg total) under the tongue every 5 (five) minutes as needed for chest pain. 25 tablet 3  . propranolol (INDERAL) 40 MG tablet Take 60 mg by mouth daily. States he increased himself due to swelling in his ankles.    . Rivaroxaban (XARELTO) 15 MG TABS tablet Take 1 tablet (15 mg total) by mouth 2 (two) times daily with a meal. 42 tablet 0   No current facility-administered medications for this visit.    Physical Exam: Filed Vitals:   03/28/15 0933  BP: 148/88  Pulse: 70  Height: 5\' 10"  (1.778 m)  Weight: 188 lb (85.276 kg)  SpO2: 98%    GEN- The patient is well appearing, alert and oriented x 3 today.   Head- normocephalic, atraumatic Eyes-  Sclera clear, conjunctiva pink Ears- hearing intact Oropharynx- clear Lungs- Clear to ausculation bilaterally, normal work of breathing Chest- pacemaker pocket is well healed Heart- Regular rate and rhythm, no murmurs, rubs or gallops, PMI not laterally displaced GI- soft, NT, ND, + BS Extremities- no clubbing, cyanosis, trace edema  Pacemaker interrogation- reviewed in detail today,  See PACEART report  Assessment and Plan:  ATRIOVENTRICULAR BLOCK, MOBITZ TYPE II   Normal  pacemaker function  See Pace Art report  No changes today   ATRIAL FIBRILLATION  Maintaining sinus rhythm with only short episodes of afib Now on xarelto Dosed by Dr Woody Seller  CAD, NATIVE VESSEL  Stable  No change required today   HYPERTENSION, UNSPECIFIED  Stable No change required today  Return to the device clinic in 6 months I will see in a year  [name] MD, [facility] 03/28/2015 10:35 AM

## 2015-03-28 NOTE — Patient Instructions (Addendum)
Your physician recommends that you continue on your current medications as directed. Please refer to the Current Medication list given to you today. Your physician recommends that you schedule a follow-up appointment in: 6 months with the device clinic in the office. You will receive a reminder letter in the mail in about 4 months reminding you to call and schedule your appointment. If you don't receive this letter, please contact our office. Your physician recommends that you schedule a follow-up appointment in: 1 year with Dr. Rayann Heman. You can schedule this appointment today or you can wait for your letter to come in the mail in about 10 months reminding you to call and schedule this appointment. If you do not receive this letter, please contact our office for your appointment.

## 2015-04-01 LAB — CUP PACEART INCLINIC DEVICE CHECK
Battery Impedance: 2400 Ohm
Brady Statistic RA Percent Paced: 99 %
Brady Statistic RV Percent Paced: 99 %
Date Time Interrogation Session: 20161202144503
Implantable Lead Implant Date: 20100226
Implantable Lead Location: 753859
Lead Channel Impedance Value: 395 Ohm
Lead Channel Pacing Threshold Amplitude: 0.75 V
Lead Channel Setting Pacing Amplitude: 2 V
Lead Channel Setting Pacing Pulse Width: 0.4 ms
Lead Channel Setting Sensing Sensitivity: 2 mV
MDC IDC LEAD IMPLANT DT: 20100226
MDC IDC LEAD LOCATION: 753860
MDC IDC MSMT BATTERY VOLTAGE: 2.79 V
MDC IDC MSMT LEADCHNL RA PACING THRESHOLD PULSEWIDTH: 0.4 ms
MDC IDC MSMT LEADCHNL RV IMPEDANCE VALUE: 510 Ohm
Pulse Gen Model: 5826
Pulse Gen Serial Number: 2238486

## 2015-04-08 ENCOUNTER — Encounter: Payer: Self-pay | Admitting: Internal Medicine

## 2015-06-25 DIAGNOSIS — I441 Atrioventricular block, second degree: Secondary | ICD-10-CM | POA: Diagnosis not present

## 2015-06-27 ENCOUNTER — Encounter: Payer: Self-pay | Admitting: Internal Medicine

## 2015-06-27 DIAGNOSIS — I4891 Unspecified atrial fibrillation: Secondary | ICD-10-CM | POA: Diagnosis not present

## 2015-06-27 DIAGNOSIS — I441 Atrioventricular block, second degree: Secondary | ICD-10-CM

## 2015-06-30 DIAGNOSIS — I4891 Unspecified atrial fibrillation: Secondary | ICD-10-CM | POA: Diagnosis not present

## 2015-06-30 DIAGNOSIS — Z6829 Body mass index (BMI) 29.0-29.9, adult: Secondary | ICD-10-CM | POA: Diagnosis not present

## 2015-06-30 DIAGNOSIS — I10 Essential (primary) hypertension: Secondary | ICD-10-CM | POA: Diagnosis not present

## 2015-06-30 DIAGNOSIS — Z713 Dietary counseling and surveillance: Secondary | ICD-10-CM | POA: Diagnosis not present

## 2015-06-30 DIAGNOSIS — Z789 Other specified health status: Secondary | ICD-10-CM | POA: Diagnosis not present

## 2015-08-21 DIAGNOSIS — I4891 Unspecified atrial fibrillation: Secondary | ICD-10-CM | POA: Diagnosis not present

## 2015-08-21 DIAGNOSIS — I10 Essential (primary) hypertension: Secondary | ICD-10-CM | POA: Diagnosis not present

## 2015-09-09 DIAGNOSIS — I10 Essential (primary) hypertension: Secondary | ICD-10-CM | POA: Diagnosis not present

## 2015-09-09 DIAGNOSIS — I4891 Unspecified atrial fibrillation: Secondary | ICD-10-CM | POA: Diagnosis not present

## 2015-09-11 ENCOUNTER — Encounter: Payer: Self-pay | Admitting: Internal Medicine

## 2015-09-30 DIAGNOSIS — Z299 Encounter for prophylactic measures, unspecified: Secondary | ICD-10-CM | POA: Diagnosis not present

## 2015-09-30 DIAGNOSIS — I10 Essential (primary) hypertension: Secondary | ICD-10-CM | POA: Diagnosis not present

## 2015-09-30 DIAGNOSIS — I4891 Unspecified atrial fibrillation: Secondary | ICD-10-CM | POA: Diagnosis not present

## 2015-10-10 ENCOUNTER — Ambulatory Visit (INDEPENDENT_AMBULATORY_CARE_PROVIDER_SITE_OTHER): Payer: Medicare Other | Admitting: *Deleted

## 2015-10-10 ENCOUNTER — Encounter: Payer: Self-pay | Admitting: Internal Medicine

## 2015-10-10 DIAGNOSIS — I441 Atrioventricular block, second degree: Secondary | ICD-10-CM

## 2015-10-10 LAB — CUP PACEART INCLINIC DEVICE CHECK
Battery Impedance: 3000 Ohm
Battery Voltage: 2.76 V
Brady Statistic RV Percent Paced: 99 %
Date Time Interrogation Session: 20170616104733
Implantable Lead Implant Date: 20100226
Implantable Lead Location: 753860
Lead Channel Pacing Threshold Amplitude: 0.75 V
Lead Channel Pacing Threshold Pulse Width: 0.4 ms
Lead Channel Pacing Threshold Pulse Width: 0.4 ms
Lead Channel Setting Pacing Pulse Width: 0.4 ms
Lead Channel Setting Sensing Sensitivity: 2 mV
MDC IDC LEAD IMPLANT DT: 20100226
MDC IDC LEAD LOCATION: 753859
MDC IDC MSMT LEADCHNL RA IMPEDANCE VALUE: 382 Ohm
MDC IDC MSMT LEADCHNL RA SENSING INTR AMPL: 2 mV
MDC IDC MSMT LEADCHNL RV IMPEDANCE VALUE: 498 Ohm
MDC IDC MSMT LEADCHNL RV PACING THRESHOLD AMPLITUDE: 0.75 V
MDC IDC PG SERIAL: 2238486
MDC IDC SET LEADCHNL RA PACING AMPLITUDE: 2 V
MDC IDC STAT BRADY RA PERCENT PACED: 99 %
Pulse Gen Model: 5826

## 2015-10-10 NOTE — Progress Notes (Signed)
Pacemaker check in clinic. Normal device function. Thresholds, sensing, impedances consistent with previous measurements. Device programmed to maximize longevity. 1 mode switch less than 30sec. No high ventricular rates noted. Device programmed at appropriate safety margins. Histogram distribution appropriate for patient activity level. Device programmed to optimize intrinsic conduction. Estimated longevity 2.25-2.72yrs. Patient enrolled in remote follow-up/TTM's with [employer]. ROV with JA 03/26/2016. Patient education completed.

## 2015-10-16 DIAGNOSIS — I4891 Unspecified atrial fibrillation: Secondary | ICD-10-CM | POA: Diagnosis not present

## 2015-10-16 DIAGNOSIS — I10 Essential (primary) hypertension: Secondary | ICD-10-CM | POA: Diagnosis not present

## 2015-10-30 DIAGNOSIS — I4891 Unspecified atrial fibrillation: Secondary | ICD-10-CM | POA: Diagnosis not present

## 2015-10-30 DIAGNOSIS — I10 Essential (primary) hypertension: Secondary | ICD-10-CM | POA: Diagnosis not present

## 2015-11-27 DIAGNOSIS — W57XXXA Bitten or stung by nonvenomous insect and other nonvenomous arthropods, initial encounter: Secondary | ICD-10-CM | POA: Diagnosis not present

## 2015-11-27 DIAGNOSIS — R21 Rash and other nonspecific skin eruption: Secondary | ICD-10-CM | POA: Diagnosis not present

## 2015-11-27 DIAGNOSIS — Z299 Encounter for prophylactic measures, unspecified: Secondary | ICD-10-CM | POA: Diagnosis not present

## 2015-12-23 DIAGNOSIS — I4891 Unspecified atrial fibrillation: Secondary | ICD-10-CM | POA: Diagnosis not present

## 2015-12-23 DIAGNOSIS — I10 Essential (primary) hypertension: Secondary | ICD-10-CM | POA: Diagnosis not present

## 2016-01-06 ENCOUNTER — Encounter: Payer: Self-pay | Admitting: Internal Medicine

## 2016-01-06 DIAGNOSIS — I4891 Unspecified atrial fibrillation: Secondary | ICD-10-CM | POA: Diagnosis not present

## 2016-01-06 DIAGNOSIS — I441 Atrioventricular block, second degree: Secondary | ICD-10-CM | POA: Diagnosis not present

## 2016-01-15 DIAGNOSIS — I10 Essential (primary) hypertension: Secondary | ICD-10-CM | POA: Diagnosis not present

## 2016-01-15 DIAGNOSIS — I4891 Unspecified atrial fibrillation: Secondary | ICD-10-CM | POA: Diagnosis not present

## 2016-02-06 DIAGNOSIS — Z299 Encounter for prophylactic measures, unspecified: Secondary | ICD-10-CM | POA: Diagnosis not present

## 2016-02-06 DIAGNOSIS — Z23 Encounter for immunization: Secondary | ICD-10-CM | POA: Diagnosis not present

## 2016-02-06 DIAGNOSIS — I10 Essential (primary) hypertension: Secondary | ICD-10-CM | POA: Diagnosis not present

## 2016-02-06 DIAGNOSIS — I4891 Unspecified atrial fibrillation: Secondary | ICD-10-CM | POA: Diagnosis not present

## 2016-02-11 DIAGNOSIS — I4891 Unspecified atrial fibrillation: Secondary | ICD-10-CM | POA: Diagnosis not present

## 2016-02-11 DIAGNOSIS — I10 Essential (primary) hypertension: Secondary | ICD-10-CM | POA: Diagnosis not present

## 2016-03-08 DIAGNOSIS — I4891 Unspecified atrial fibrillation: Secondary | ICD-10-CM | POA: Diagnosis not present

## 2016-03-08 DIAGNOSIS — I10 Essential (primary) hypertension: Secondary | ICD-10-CM | POA: Diagnosis not present

## 2016-03-26 ENCOUNTER — Ambulatory Visit (INDEPENDENT_AMBULATORY_CARE_PROVIDER_SITE_OTHER): Payer: Medicare Other | Admitting: Internal Medicine

## 2016-03-26 ENCOUNTER — Encounter: Payer: Self-pay | Admitting: Internal Medicine

## 2016-03-26 VITALS — BP 170/96 | HR 75 | Ht 70.0 in | Wt 186.0 lb

## 2016-03-26 DIAGNOSIS — I441 Atrioventricular block, second degree: Secondary | ICD-10-CM | POA: Diagnosis not present

## 2016-03-26 DIAGNOSIS — I480 Paroxysmal atrial fibrillation: Secondary | ICD-10-CM

## 2016-03-26 DIAGNOSIS — Z7901 Long term (current) use of anticoagulants: Secondary | ICD-10-CM

## 2016-03-26 NOTE — Progress Notes (Signed)
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  Since last being seen in our clinic, the patient reports doing very well.  He still drives and remains active.  Today, he denies symptoms of palpitations, chest pain, shortness of breath,  lower extremity edema, dizziness, presyncope, or syncope.  The patient is otherwise without complaint today.   Past Medical History:  Diagnosis Date  . Atrial flutter [facility])    s/p CTI ablation October 2010 by Dr Caryl Comes  . Atrioventricular block, type II    s/p PPM  . BPH (benign prostatic hyperplasia)   . CAD (coronary artery disease)    s/p CABG  . Ejection fraction    45%..echo.Marland KitchenMarland KitchenFebruary,2010/ EF 60%....TEE.Marland KitchenMarland KitchenOctober,2010  . Hx of CABG   . Hyperlipemia   . Hypertension   . Mitral regurgitation    mild...echo.Marland KitchenMarland KitchenFebruary 2010  . Pacemaker   . Paroxysmal atrial fibrillation (HCC)   . PFO (patent foramen ovale)    small....TEE....October,2010  . Prostate cancer ([place])   . Pulmonary emboli ([place])    on xarelto   Past Surgical History:  Procedure Laterality Date  . ATRIAL ABLATION SURGERY  2010   CTI ablation for atrial flutter by Dr Caryl Comes 2010  . BACK SURGERY    . CARDIAC PACEMAKER PLACEMENT  06/21/2008   St. Jude dual-chamber permanent pacemaker  . CORONARY ARTERY BYPASS GRAFT  06/17/2008   5 vessel  . INGUINAL HERNIA REPAIR     RIGHT  . LEFT LOWER EXTREMITY REPAIR     from trauma in the distant past secondary to a fall  . TOTAL HIP ARTHROPLASTY    . TOTAL KNEE ARTHROPLASTY      Current Outpatient Prescriptions  Medication Sig Dispense Refill  . nitroGLYCERIN (NITROSTAT) 0.4 MG SL tablet Place 1 tablet (0.4 mg total) under the tongue every 5 (five) minutes as needed for chest pain. 25 tablet 3  . propranolol (INDERAL) 40 MG tablet Take 60 mg by mouth daily. States he increased himself due to swelling in his ankles.    . rivaroxaban  (XARELTO) 20 MG TABS tablet Take 20 mg by mouth daily with supper.     No current facility-administered medications for this visit.     Physical Exam: Vitals:   03/26/16 1016  BP: (!) 170/96  Pulse: 75  SpO2: 99%  Weight: 186 lb (84.4 kg)  Height: 5\' 10"  (1.778 m)    GEN- The patient is well appearing, alert and oriented x 3 today.   Head- normocephalic, atraumatic Eyes-  Sclera clear, conjunctiva pink Ears- hearing diminished Oropharynx- clear Lungs- Clear to ausculation bilaterally, normal work of breathing Chest- pacemaker pocket is well healed Heart- Regular rate and rhythm  GI- soft, NT, ND, + BS Extremities- no clubbing, cyanosis, trace edema  Pacemaker interrogation- reviewed in detail today,  See PACEART report  Assessment and Plan:  ATRIOVENTRICULAR BLOCK, MOBITZ TYPE II   Normal pacemaker function  See Pace Art report  No changes today   ATRIAL FIBRILLATION  Maintaining sinus rhythm with only short episodes of afib Now on xarelto for PTE by Dr Woody Seller  (dosed by Dr Woody Seller).  He says he is only taking xarelto daily.  CAD, NATIVE VESSEL  Stable  No change required today  Stop ASA as he is also on anticoagulation  HYPERTENSION, UNSPECIFIED  Elevated today though he reports good BP control at home No changes today  Return to the device clinic in 6 months I will see in a year  [name] MD, [facility] 03/26/2016 10:58 AM

## 2016-03-26 NOTE — Patient Instructions (Addendum)
Medication Instructions:   Remain off of the Aspirin.    Continue Xarelto 20mg  every evening.  Continue all other medications.    Labwork: none  Testing/Procedures: none  Follow-Up: Your physician wants you to follow up in:  1 year.  You will receive a reminder letter in the mail one-two months in advance.  If you don't receive a letter, please call our office to schedule the follow up appointment - Dr. Rayann Heman,   Any Other Special Instructions Will Be Listed Below (If Applicable). Your physician wants you to follow up in: 6 months.  You will receive a reminder letter in the mail one-two months in advance.  If you don't receive a letter, please call our office to schedule the follow up appointment - device clinic.   If you need a refill on your cardiac medications before your next appointment, please call your pharmacy.

## 2016-03-30 LAB — CUP PACEART INCLINIC DEVICE CHECK
Date Time Interrogation Session: 20171201162105
Implantable Lead Implant Date: 20100226
Implantable Lead Location: 753859
Implantable Lead Location: 753860
Implantable Pulse Generator Implant Date: 20100226
Lead Channel Impedance Value: 482 Ohm
Lead Channel Pacing Threshold Amplitude: 0.75 V
Lead Channel Pacing Threshold Pulse Width: 0.4 ms
Lead Channel Setting Sensing Sensitivity: 2 mV
MDC IDC LEAD IMPLANT DT: 20100226
MDC IDC MSMT BATTERY IMPEDANCE: 4100 Ohm
MDC IDC MSMT BATTERY VOLTAGE: 2.76 V
MDC IDC MSMT LEADCHNL RA IMPEDANCE VALUE: 379 Ohm
MDC IDC MSMT LEADCHNL RA PACING THRESHOLD AMPLITUDE: 0.75 V
MDC IDC MSMT LEADCHNL RA PACING THRESHOLD PULSEWIDTH: 0.4 ms
MDC IDC MSMT LEADCHNL RA SENSING INTR AMPL: 1.4 mV
MDC IDC PG SERIAL: 2238486
MDC IDC SET LEADCHNL RA PACING AMPLITUDE: 2 V
MDC IDC SET LEADCHNL RV PACING PULSEWIDTH: 0.4 ms
Pulse Gen Model: 5826

## 2016-04-02 DIAGNOSIS — Z1211 Encounter for screening for malignant neoplasm of colon: Secondary | ICD-10-CM | POA: Diagnosis not present

## 2016-04-02 DIAGNOSIS — R5383 Other fatigue: Secondary | ICD-10-CM | POA: Diagnosis not present

## 2016-04-02 DIAGNOSIS — Z0000 Encounter for general adult medical examination without abnormal findings: Secondary | ICD-10-CM | POA: Diagnosis not present

## 2016-04-02 DIAGNOSIS — Z7189 Other specified counseling: Secondary | ICD-10-CM | POA: Diagnosis not present

## 2016-04-02 DIAGNOSIS — Z79899 Other long term (current) drug therapy: Secondary | ICD-10-CM | POA: Diagnosis not present

## 2016-04-02 DIAGNOSIS — Z125 Encounter for screening for malignant neoplasm of prostate: Secondary | ICD-10-CM | POA: Diagnosis not present

## 2016-04-02 DIAGNOSIS — Z299 Encounter for prophylactic measures, unspecified: Secondary | ICD-10-CM | POA: Diagnosis not present

## 2016-04-02 DIAGNOSIS — E7800 Pure hypercholesterolemia, unspecified: Secondary | ICD-10-CM | POA: Diagnosis not present

## 2016-04-02 DIAGNOSIS — Z1389 Encounter for screening for other disorder: Secondary | ICD-10-CM | POA: Diagnosis not present

## 2016-04-09 DIAGNOSIS — I4891 Unspecified atrial fibrillation: Secondary | ICD-10-CM | POA: Diagnosis not present

## 2016-04-09 DIAGNOSIS — I10 Essential (primary) hypertension: Secondary | ICD-10-CM | POA: Diagnosis not present

## 2016-05-05 DIAGNOSIS — I4891 Unspecified atrial fibrillation: Secondary | ICD-10-CM | POA: Diagnosis not present

## 2016-05-05 DIAGNOSIS — I10 Essential (primary) hypertension: Secondary | ICD-10-CM | POA: Diagnosis not present

## 2016-06-30 ENCOUNTER — Encounter: Payer: Self-pay | Admitting: Internal Medicine

## 2016-06-30 DIAGNOSIS — I441 Atrioventricular block, second degree: Secondary | ICD-10-CM | POA: Diagnosis not present

## 2016-06-30 DIAGNOSIS — I4891 Unspecified atrial fibrillation: Secondary | ICD-10-CM | POA: Diagnosis not present

## 2016-07-09 DIAGNOSIS — Z299 Encounter for prophylactic measures, unspecified: Secondary | ICD-10-CM | POA: Diagnosis not present

## 2016-07-09 DIAGNOSIS — C61 Malignant neoplasm of prostate: Secondary | ICD-10-CM | POA: Diagnosis not present

## 2016-07-09 DIAGNOSIS — Z713 Dietary counseling and surveillance: Secondary | ICD-10-CM | POA: Diagnosis not present

## 2016-07-09 DIAGNOSIS — I4891 Unspecified atrial fibrillation: Secondary | ICD-10-CM | POA: Diagnosis not present

## 2016-07-09 DIAGNOSIS — Z6827 Body mass index (BMI) 27.0-27.9, adult: Secondary | ICD-10-CM | POA: Diagnosis not present

## 2016-07-09 DIAGNOSIS — I10 Essential (primary) hypertension: Secondary | ICD-10-CM | POA: Diagnosis not present

## 2016-08-17 ENCOUNTER — Ambulatory Visit (INDEPENDENT_AMBULATORY_CARE_PROVIDER_SITE_OTHER): Payer: Medicare Other | Admitting: *Deleted

## 2016-08-17 DIAGNOSIS — I441 Atrioventricular block, second degree: Secondary | ICD-10-CM | POA: Diagnosis not present

## 2016-08-17 LAB — CUP PACEART INCLINIC DEVICE CHECK
Battery Impedance: 6200 Ohm
Battery Voltage: 2.73 V
Implantable Lead Implant Date: 20100226
Implantable Lead Location: 753859
Implantable Lead Location: 753860
Lead Channel Pacing Threshold Pulse Width: 0.4 ms
Lead Channel Setting Pacing Amplitude: 2 V
Lead Channel Setting Pacing Pulse Width: 0.4 ms
MDC IDC LEAD IMPLANT DT: 20100226
MDC IDC MSMT LEADCHNL RA IMPEDANCE VALUE: 392 Ohm
MDC IDC MSMT LEADCHNL RA PACING THRESHOLD AMPLITUDE: 0.75 V
MDC IDC MSMT LEADCHNL RV IMPEDANCE VALUE: 493 Ohm
MDC IDC MSMT LEADCHNL RV PACING THRESHOLD AMPLITUDE: 0.75 V
MDC IDC MSMT LEADCHNL RV PACING THRESHOLD PULSEWIDTH: 0.4 ms
MDC IDC PG IMPLANT DT: 20100226
MDC IDC SESS DTM: 20180424143752
MDC IDC SET LEADCHNL RV SENSING SENSITIVITY: 2 mV
MDC IDC STAT BRADY RA PERCENT PACED: 99 %
MDC IDC STAT BRADY RV PERCENT PACED: 99 %
Pulse Gen Model: 5826
Pulse Gen Serial Number: 2238486

## 2016-08-17 NOTE — Progress Notes (Signed)
Pacemaker check in clinic. Normal device function. Thresholds, sensing, impedances consistent with previous measurements. Device programmed to maximize longevity. No mode switch or high ventricular rates noted. Device programmed at appropriate safety margins. Histogram distribution appropriate for patient activity level. Device programmed to optimize intrinsic conduction. Estimated longevity 1.21.50 years. ROV w/ JA 03/2017. Patient education completed.

## 2016-08-31 DIAGNOSIS — I4891 Unspecified atrial fibrillation: Secondary | ICD-10-CM | POA: Diagnosis not present

## 2016-08-31 DIAGNOSIS — I10 Essential (primary) hypertension: Secondary | ICD-10-CM | POA: Diagnosis not present

## 2016-09-10 ENCOUNTER — Emergency Department (HOSPITAL_COMMUNITY): Payer: Medicare Other

## 2016-09-10 ENCOUNTER — Emergency Department (HOSPITAL_COMMUNITY)
Admission: EM | Admit: 2016-09-10 | Discharge: 2016-09-10 | Disposition: A | Discharge status: Home / Self Care | Payer: Medicare Other | Attending: Emergency Medicine | Admitting: Emergency Medicine

## 2016-09-10 ENCOUNTER — Encounter (HOSPITAL_COMMUNITY): Payer: Self-pay

## 2016-09-10 DIAGNOSIS — Z7901 Long term (current) use of anticoagulants: Secondary | ICD-10-CM | POA: Diagnosis not present

## 2016-09-10 DIAGNOSIS — I10 Essential (primary) hypertension: Secondary | ICD-10-CM | POA: Insufficient documentation

## 2016-09-10 DIAGNOSIS — R4182 Altered mental status, unspecified: Secondary | ICD-10-CM | POA: Diagnosis not present

## 2016-09-10 DIAGNOSIS — Z79899 Other long term (current) drug therapy: Secondary | ICD-10-CM | POA: Diagnosis not present

## 2016-09-10 DIAGNOSIS — J9811 Atelectasis: Secondary | ICD-10-CM | POA: Diagnosis not present

## 2016-09-10 DIAGNOSIS — R2681 Unsteadiness on feet: Secondary | ICD-10-CM | POA: Diagnosis not present

## 2016-09-10 DIAGNOSIS — R531 Weakness: Secondary | ICD-10-CM | POA: Diagnosis present

## 2016-09-10 LAB — COMPREHENSIVE METABOLIC PANEL
ALT: 10 U/L — ABNORMAL LOW (ref 17–63)
AST: 16 U/L (ref 15–41)
Albumin: 4.1 g/dL (ref 3.5–5.0)
Alkaline Phosphatase: 69 U/L (ref 38–126)
Anion gap: 5 (ref 5–15)
BILIRUBIN TOTAL: 0.5 mg/dL (ref 0.3–1.2)
BUN: 20 mg/dL (ref 6–20)
CHLORIDE: 103 mmol/L (ref 101–111)
CO2: 28 mmol/L (ref 22–32)
CREATININE: 1.07 mg/dL (ref 0.61–1.24)
Calcium: 8.9 mg/dL (ref 8.9–10.3)
GFR calc non Af Amer: 59 mL/min — ABNORMAL LOW (ref >60)
Glucose, Bld: 101 mg/dL — ABNORMAL HIGH (ref 65–99)
POTASSIUM: 4.5 mmol/L (ref 3.5–5.1)
Sodium: 136 mmol/L (ref 135–145)
TOTAL PROTEIN: 7.4 g/dL (ref 6.5–8.1)

## 2016-09-10 LAB — RAPID URINE DRUG SCREEN, HOSP PERFORMED
Amphetamines: NOT DETECTED
Barbiturates: NOT DETECTED
Benzodiazepines: NOT DETECTED
COCAINE: NOT DETECTED
OPIATES: NOT DETECTED
TETRAHYDROCANNABINOL: NOT DETECTED

## 2016-09-10 LAB — URINALYSIS, ROUTINE W REFLEX MICROSCOPIC
BILIRUBIN URINE: NEGATIVE
Bacteria, UA: NONE SEEN
GLUCOSE, UA: NEGATIVE mg/dL
Ketones, ur: NEGATIVE mg/dL
LEUKOCYTES UA: NEGATIVE
NITRITE: NEGATIVE
Protein, ur: NEGATIVE mg/dL
SQUAMOUS EPITHELIAL / LPF: NONE SEEN
Specific Gravity, Urine: 1.017 (ref 1.005–1.030)
pH: 6 (ref 5.0–8.0)

## 2016-09-10 LAB — DIFFERENTIAL
BASOS PCT: 0 %
Basophils Absolute: 0 10*3/uL (ref 0.0–0.1)
EOS ABS: 0.3 10*3/uL (ref 0.0–0.7)
Eosinophils Relative: 4 %
LYMPHS ABS: 2.5 10*3/uL (ref 0.7–4.0)
Lymphocytes Relative: 33 %
MONO ABS: 0.5 10*3/uL (ref 0.1–1.0)
MONOS PCT: 6 %
Neutro Abs: 4.2 10*3/uL (ref 1.7–7.7)
Neutrophils Relative %: 57 %

## 2016-09-10 LAB — CBC
HCT: 42.9 % (ref 39.0–52.0)
Hemoglobin: 14.4 g/dL (ref 13.0–17.0)
MCH: 32.4 pg (ref 26.0–34.0)
MCHC: 33.6 g/dL (ref 30.0–36.0)
MCV: 96.4 fL (ref 78.0–100.0)
Platelets: 184 10*3/uL (ref 150–400)
RBC: 4.45 MIL/uL (ref 4.22–5.81)
RDW: 12.8 % (ref 11.5–15.5)
WBC: 7.5 10*3/uL (ref 4.0–10.5)

## 2016-09-10 LAB — PROTIME-INR
INR: 1.1
Prothrombin Time: 14.2 seconds (ref 11.4–15.2)

## 2016-09-10 LAB — APTT: aPTT: 37 seconds — ABNORMAL HIGH (ref 24–36)

## 2016-09-10 LAB — TROPONIN I

## 2016-09-10 LAB — ETHANOL

## 2016-09-10 MED ORDER — SODIUM CHLORIDE 0.9 % IV BOLUS (SEPSIS)
500.0000 mL | Freq: Once | INTRAVENOUS | Status: AC
  Administered 2016-09-10: 500 mL via INTRAVENOUS

## 2016-09-10 NOTE — ED Provider Notes (Signed)
[facility] DEPT Provider Note   CSN: [id number] Arrival date & time: 09/10/16  1315  By signing my name below, I, [name]., attest that this documentation has been prepared under the direction and in the presence of [name], MD. Electronically signed: [name]., ED Scribe. 09/10/16. 5:50 PM.    History   Chief Complaint Chief Complaint  Patient presents with  . Weakness    HPI Stephen Olsen is a 83 y.o. male with hx of pacemaker who presents to the Emergency Department complaining of abnoraml gait with onset x11 hours. Pt states that he awoke this morning to use the restroom and "felt like he was drunk." He states that when he stands, he experiences abnormal gait, dizziness and weakness. He denies any modifying factors. Pt denies chest pain, SOB, fever. Of note, pt states that he was awaken from sleep with this issue x11 hours ago so he ate breakfast   Weakness  Primary symptoms include loss of balance, dizziness. This is a new problem. The current episode started 6 to 12 hours ago. The problem has not changed since onset.There was no focality noted. There has been no fever. Pertinent negatives include no shortness of breath, no chest pain, no vomiting and no confusion. There were no medications administered prior to arrival.    Past Medical History:  Diagnosis Date  . Atrial flutter [facility])    s/p CTI ablation October 2010 by Dr Caryl Comes  . Atrioventricular block, type II    s/p PPM  . BPH (benign prostatic hyperplasia)   . CAD (coronary artery disease)    s/p CABG  . Ejection fraction    45%..echo.Marland KitchenMarland KitchenFebruary,2010/ EF 60%....TEE.Marland KitchenMarland KitchenOctober,2010  . Hx of CABG   . Hyperlipemia   . Hypertension   . Mitral regurgitation    mild...echo.Marland KitchenMarland KitchenFebruary 2010  . Pacemaker   . Paroxysmal atrial fibrillation (HCC)   . PFO (patent foramen ovale)    small....TEE....October,2010  . Prostate cancer ([place])   . Pulmonary emboli ([place])    on xarelto     Patient Active Problem List   Diagnosis Date Noted  . Pulmonary embolism ([place]) 01/03/2015  . Chest pain 01/03/2015  . Hx of CABG   . Mitral regurgitation   . Hyperlipemia   . Hypertension   . PFO (patent foramen ovale)   . Ejection fraction   . Atrioventricular block, type II   . Atrial flutter ([place])   . Pacemaker-St.Jude 04/06/2012  . Long term current use of anticoagulant 07/10/2010  . Paroxysmal atrial fibrillation ([place]) 05/04/2010  . CAD, NATIVE VESSEL 12/04/2008    Past Surgical History:  Procedure Laterality Date  . ATRIAL ABLATION SURGERY  2010   CTI ablation for atrial flutter by Dr Caryl Comes 2010  . BACK SURGERY    . CARDIAC PACEMAKER PLACEMENT  06/21/2008   St. Jude dual-chamber permanent pacemaker  . CORONARY ARTERY BYPASS GRAFT  06/17/2008   5 vessel  . INGUINAL HERNIA REPAIR     RIGHT  . LEFT LOWER EXTREMITY REPAIR     from trauma in the distant past secondary to a fall  . TOTAL HIP ARTHROPLASTY    . TOTAL KNEE ARTHROPLASTY         Home Medications    Prior to Admission medications   Medication Sig Start Date End Date Taking? Authorizing Provider  ibuprofen (ADVIL,MOTRIN) 200 MG tablet Take 200 mg by mouth every 6 (six) hours as needed for moderate pain.   Yes [provider]  nitroGLYCERIN (NITROSTAT) 0.4 MG SL tablet Place 1 tablet (0.4 mg total) under the tongue every 5 (five) minutes as needed for chest pain. 08/28/13  Yes Carlena Bjornstad, MD  propranolol (INDERAL) 40 MG tablet Take 60 mg by mouth 2 (two) times daily. States he increased himself due to swelling in his ankles.   Yes [provider]  rivaroxaban (XARELTO) 20 MG TABS tablet Take 20 mg by mouth daily with supper.   Yes [provider]    Family History History reviewed. No pertinent family history.  Social History Social History  Substance Use Topics  . Smoking status: Never Smoker  . Smokeless tobacco: Never Used  . Alcohol use No     Allergies    Rosuvastatin   Review of Systems Review of Systems  Constitutional: Negative for fever.  Respiratory: Negative for shortness of breath.   Cardiovascular: Negative for chest pain.  Gastrointestinal: Negative for vomiting.  Neurological: Positive for dizziness, weakness and loss of balance.  Psychiatric/Behavioral: Negative for confusion.  All other systems reviewed and are negative.    Physical Exam Updated Vital Signs BP (!) 153/91 (BP Location: Left Arm)   Pulse 70   Temp 97.5 F (36.4 C)   Resp 13   Ht 5\' 10"  (1.778 m)   Wt 188 lb (85.3 kg)   SpO2 100%   BMI 26.98 kg/m   Physical Exam  Constitutional: He appears well-developed and well-nourished.  Pt is awake and alert, hard of hearing.   HENT:  Head: Normocephalic and atraumatic.  Lips are mildly blue.   Eyes: Conjunctivae are normal.  L pupil is 71mm, R pupil is 51mm both reactive to light.  Neck: Neck supple.  Cardiovascular: Normal rate and regular rhythm.   No murmur heard. Capillary refll <3 seconds.  Pulmonary/Chest: Effort normal and breath sounds normal. No respiratory distress.  Pt has pacemaker placed in the L chest wall.   Abdominal: Soft. There is no tenderness.  Musculoskeletal: He exhibits no edema.  Neurological: He is alert.  Face is symmetric, speech is normal. Pt has good grip strength bilaterally. Normal finger to nose bilaterally. Pt is able to sit up without dizziness, but standing causes dizziness. Pt is appropriately moving all extremities.   Skin: Skin is warm and dry.  Pt has scar to the center of chest from previous sternotomy.  Psychiatric: He has a normal mood and affect.  Nursing note and vitals reviewed.    ED Treatments / Results   DIAGNOSTIC STUDIES: Oxygen Saturation is 97% on RA, adequate by my interpretation.   COORDINATION OF CARE: 5:50 PM-Discussed next steps with pt. Pt verbalized understanding and is agreeable with the plan.    Labs (all labs ordered are  listed, but only abnormal results are displayed) Labs Reviewed  APTT - Abnormal; Notable for the following:       Result Value   aPTT 37 (*)    All other components within normal limits  COMPREHENSIVE METABOLIC PANEL - Abnormal; Notable for the following:    Glucose, Bld 101 (*)    ALT 10 (*)    GFR calc non Af Amer 59 (*)    All other components within normal limits  URINALYSIS, ROUTINE W REFLEX MICROSCOPIC - Abnormal; Notable for the following:    APPearance HAZY (*)    Hgb urine dipstick LARGE (*)    All other components within normal limits  ETHANOL  PROTIME-INR  CBC  DIFFERENTIAL  RAPID URINE DRUG  SCREEN, HOSP PERFORMED  TROPONIN I    EKG  EKG Interpretation  Date/Time:  Friday Sep 10 2016 13:26:07 EDT Ventricular Rate:  70 PR Interval:    QRS Duration: 173 QT Interval:  431 QTC Calculation: 466 R Axis:   -90 Text Interpretation:  Atrial-ventricular dual-paced rhythm No further analysis attempted due to paced rhythm Confirmed by Alvino Chapel  MD, Lianna Sitzmann [phone]) on 09/10/2016 1:58:07 PM       Radiology Dg Chest 2 View  Result Date: 09/10/2016 CLINICAL DATA:  Abnormal gait with dizziness and weakness EXAM: CHEST  2 VIEW COMPARISON:  01/03/2015 CT, radiograph 07/09/2008 FINDINGS: Left-sided duo lead pacing device as before. Post sternotomy changes. Streaky atelectasis or scar at both lung bases. No focal consolidation or pleural effusion. Mild cardiomegaly with atherosclerosis. No pneumothorax. Degenerative changes of the spine. IMPRESSION: Streaky atelectasis or scarring at the lung bases. No acute infiltrate. Stable mild cardiomegaly without overt failure. Electronically Signed   By: Donavan Foil M.D.   On: 09/10/2016 14:37   Ct Head Wo Contrast  Result Date: 09/10/2016 CLINICAL DATA:  Abnormal gait for 11 hours.  Altered mental status. EXAM: CT HEAD WITHOUT CONTRAST TECHNIQUE: Contiguous axial images were obtained from the base of the skull through the vertex without  intravenous contrast. COMPARISON:  None. FINDINGS: Brain: Mild generalized atrophy and white matter disease is present bilaterally. The basal ganglia are intact. Lentiform nucleus is normal. There is no significant asymmetry of the matter disease. No focal cortical defect is present. The ventricles are proportionate to the degree of atrophy. No significant extra-axial fluid collection is present. Vascular: No hyperdense vessel or unexpected calcification. Skull: Calvarium is intact. No focal lytic or blastic lesion is present. Sinuses/Orbits: Minimal mucosal thickening is present along the floor of the left maxillary sinus. The remaining paranasal sinuses and the mastoid air cells are clear. Bilateral lens replacements are present. The globes and orbits are otherwise unremarkable. IMPRESSION: 1. Mild atrophy and white matter changes are likely within normal limits for age. 2. No acute or focal intracranial abnormality. 3. Minimal left maxillary sinus disease. Electronically Signed   By: San Morelle M.D.   On: 09/10/2016 15:05    Procedures Procedures (including critical care time)  Medications Ordered in ED Medications  sodium chloride 0.9 % bolus 500 mL (0 mLs Intravenous Stopped 09/10/16 1633)     Initial Impression / Assessment and Plan / ED Course  I have reviewed the triage vital signs and the nursing notes.  Pertinent labs & imaging results that were available during my care of the patient were reviewed by me and considered in my medical decision making (see chart for details).     Patient was unsteady with standing. Feels much better after IV fluids. Lab work overall reassuring. CT scan reassuring. He is artery on anticoagulation. He does have a pacemaker so cannot do an MRI. Has had a known small PFO also. I think stroke is felt less likely. His had peripheral vertigo in the past and this is likely either vertigo or dehydration. His color initially was a little off it is improved 5  fluids also. Patient is ambulated well in the ER now after the treatment and will be discharged home.  Final Clinical Impressions(s) / ED Diagnoses   Final diagnoses:  Unsteadiness on feet    New Prescriptions New Prescriptions   No medications on file   I personally performed the services described in this documentation, which was scribed in my presence. The recorded  information has been reviewed and is accurate.       [name], MD 09/10/16 1750

## 2016-09-10 NOTE — ED Triage Notes (Signed)
Patient went to bed at 2000 on 09/10/2016 last night and was fine.  Woke up at 0300 this morning with an unsteady gait and dizziness.  NOS.  Denies unilateral weakness, no slurred speech, no confusion.

## 2016-10-12 DIAGNOSIS — I2699 Other pulmonary embolism without acute cor pulmonale: Secondary | ICD-10-CM | POA: Diagnosis not present

## 2016-10-12 DIAGNOSIS — I4891 Unspecified atrial fibrillation: Secondary | ICD-10-CM | POA: Diagnosis not present

## 2016-10-12 DIAGNOSIS — I2510 Atherosclerotic heart disease of native coronary artery without angina pectoris: Secondary | ICD-10-CM | POA: Diagnosis not present

## 2016-10-12 DIAGNOSIS — I10 Essential (primary) hypertension: Secondary | ICD-10-CM | POA: Diagnosis not present

## 2016-11-16 DIAGNOSIS — I10 Essential (primary) hypertension: Secondary | ICD-10-CM | POA: Diagnosis not present

## 2016-11-16 DIAGNOSIS — N400 Enlarged prostate without lower urinary tract symptoms: Secondary | ICD-10-CM | POA: Diagnosis not present

## 2016-11-16 DIAGNOSIS — Z6827 Body mass index (BMI) 27.0-27.9, adult: Secondary | ICD-10-CM | POA: Diagnosis not present

## 2016-11-16 DIAGNOSIS — B029 Zoster without complications: Secondary | ICD-10-CM | POA: Diagnosis not present

## 2016-11-16 DIAGNOSIS — E7800 Pure hypercholesterolemia, unspecified: Secondary | ICD-10-CM | POA: Diagnosis not present

## 2016-11-16 DIAGNOSIS — I4891 Unspecified atrial fibrillation: Secondary | ICD-10-CM | POA: Diagnosis not present

## 2016-11-16 DIAGNOSIS — Z713 Dietary counseling and surveillance: Secondary | ICD-10-CM | POA: Diagnosis not present

## 2016-11-16 DIAGNOSIS — Z299 Encounter for prophylactic measures, unspecified: Secondary | ICD-10-CM | POA: Diagnosis not present

## 2016-11-23 DIAGNOSIS — B029 Zoster without complications: Secondary | ICD-10-CM | POA: Diagnosis not present

## 2016-11-23 DIAGNOSIS — I2510 Atherosclerotic heart disease of native coronary artery without angina pectoris: Secondary | ICD-10-CM | POA: Diagnosis not present

## 2016-11-23 DIAGNOSIS — Z299 Encounter for prophylactic measures, unspecified: Secondary | ICD-10-CM | POA: Diagnosis not present

## 2016-11-23 DIAGNOSIS — I10 Essential (primary) hypertension: Secondary | ICD-10-CM | POA: Diagnosis not present

## 2016-11-23 DIAGNOSIS — N400 Enlarged prostate without lower urinary tract symptoms: Secondary | ICD-10-CM | POA: Diagnosis not present

## 2016-11-23 DIAGNOSIS — E7800 Pure hypercholesterolemia, unspecified: Secondary | ICD-10-CM | POA: Diagnosis not present

## 2016-11-23 DIAGNOSIS — Z6826 Body mass index (BMI) 26.0-26.9, adult: Secondary | ICD-10-CM | POA: Diagnosis not present

## 2016-11-23 DIAGNOSIS — I2699 Other pulmonary embolism without acute cor pulmonale: Secondary | ICD-10-CM | POA: Diagnosis not present

## 2016-11-23 DIAGNOSIS — I4891 Unspecified atrial fibrillation: Secondary | ICD-10-CM | POA: Diagnosis not present

## 2016-12-20 ENCOUNTER — Encounter (HOSPITAL_COMMUNITY): Payer: Self-pay | Admitting: Emergency Medicine

## 2016-12-20 ENCOUNTER — Emergency Department (HOSPITAL_COMMUNITY)
Admission: EM | Admit: 2016-12-20 | Discharge: 2016-12-20 | Disposition: A | Discharge status: Home / Self Care | Payer: Medicare Other | Attending: Emergency Medicine | Admitting: Emergency Medicine

## 2016-12-20 DIAGNOSIS — Z79899 Other long term (current) drug therapy: Secondary | ICD-10-CM | POA: Insufficient documentation

## 2016-12-20 DIAGNOSIS — Z96649 Presence of unspecified artificial hip joint: Secondary | ICD-10-CM | POA: Diagnosis not present

## 2016-12-20 DIAGNOSIS — Z950 Presence of cardiac pacemaker: Secondary | ICD-10-CM | POA: Insufficient documentation

## 2016-12-20 DIAGNOSIS — I2510 Atherosclerotic heart disease of native coronary artery without angina pectoris: Secondary | ICD-10-CM | POA: Insufficient documentation

## 2016-12-20 DIAGNOSIS — I10 Essential (primary) hypertension: Secondary | ICD-10-CM | POA: Insufficient documentation

## 2016-12-20 DIAGNOSIS — Z96659 Presence of unspecified artificial knee joint: Secondary | ICD-10-CM | POA: Insufficient documentation

## 2016-12-20 DIAGNOSIS — R21 Rash and other nonspecific skin eruption: Secondary | ICD-10-CM | POA: Diagnosis not present

## 2016-12-20 DIAGNOSIS — Z951 Presence of aortocoronary bypass graft: Secondary | ICD-10-CM | POA: Diagnosis not present

## 2016-12-20 DIAGNOSIS — Z8546 Personal history of malignant neoplasm of prostate: Secondary | ICD-10-CM | POA: Insufficient documentation

## 2016-12-20 MED ORDER — TRIAMCINOLONE ACETONIDE 0.025 % EX OINT: 1.0000 "application " | TOPICAL_OINTMENT | Freq: Two times a day (BID) | CUTANEOUS | 1 refills | Status: DC

## 2016-12-20 MED ORDER — TRIAMCINOLONE ACETONIDE 0.1 % EX CREA
TOPICAL_CREAM | CUTANEOUS | Status: AC
  Administered 2016-12-20: 15:00:00 via TOPICAL
  Filled 2016-12-20: qty 15

## 2016-12-20 NOTE — Discharge Instructions (Signed)
Applied the topical steroid cream twice a day to these lesions. If you're having worsening swelling redness spreading redness, increased itching or any difficulty breathing you should seek medical attention immediately. Please follow-up with your family doctor within 2 days.

## 2016-12-20 NOTE — ED Triage Notes (Signed)
Patient complains of itching and rash on back and bilateral legs. Patient's family states patient was treated for shingles 3 weeks ago. Rash is scabbed over.

## 2016-12-20 NOTE — ED Provider Notes (Signed)
[facility] DEPT Provider Note   CSN: [id number] Arrival date & time: 12/20/16  1241     History   Chief Complaint Chief Complaint  Patient presents with  . Rash    HPI Stephen Olsen is a 83 y.o. male.  HPI  The pt is a 83 y/o male - he was recently treated for shingles of his R lower back / flank and side - this was treated and the pt improved however he has since developed a diffuse itchy rash to the arms, legs and trunk after he was walking in tall grass in his yard.  He has had this for a couple of days - got worse last night - familyi member covered him in calamine lotion which helped.  There has been no changes in his shingles rash which seems to have improved and scabbed over.  Past Medical History:  Diagnosis Date  . Atrial flutter [facility])    s/p CTI ablation October 2010 by Dr Caryl Comes  . Atrioventricular block, type II    s/p PPM  . BPH (benign prostatic hyperplasia)   . CAD (coronary artery disease)    s/p CABG  . Ejection fraction    45%..echo.Marland KitchenMarland KitchenFebruary,2010/ EF 60%....TEE.Marland KitchenMarland KitchenOctober,2010  . Hx of CABG   . Hyperlipemia   . Hypertension   . Mitral regurgitation    mild...echo.Marland KitchenMarland KitchenFebruary 2010  . Pacemaker   . Paroxysmal atrial fibrillation (HCC)   . PFO (patent foramen ovale)    small....TEE....October,2010  . Prostate cancer ([place])   . Pulmonary emboli ([place])    on xarelto    Patient Active Problem List   Diagnosis Date Noted  . Pulmonary embolism ([place]) 01/03/2015  . Chest pain 01/03/2015  . Hx of CABG   . Mitral regurgitation   . Hyperlipemia   . Hypertension   . PFO (patent foramen ovale)   . Ejection fraction   . Atrioventricular block, type II   . Atrial flutter ([place])   . Pacemaker-St.Jude 04/06/2012  . Long term current use of anticoagulant 07/10/2010  . Paroxysmal atrial fibrillation ([place]) 05/04/2010  . CAD, NATIVE VESSEL 12/04/2008    Past Surgical History:  Procedure Laterality Date  . ATRIAL ABLATION SURGERY  2010   CTI ablation for  atrial flutter by Dr Caryl Comes 2010  . BACK SURGERY    . CARDIAC PACEMAKER PLACEMENT  06/21/2008   St. Jude dual-chamber permanent pacemaker  . CORONARY ARTERY BYPASS GRAFT  06/17/2008   5 vessel  . INGUINAL HERNIA REPAIR     RIGHT  . LEFT LOWER EXTREMITY REPAIR     from trauma in the distant past secondary to a fall  . TOTAL HIP ARTHROPLASTY    . TOTAL KNEE ARTHROPLASTY         Home Medications    Prior to Admission medications   Medication Sig Start Date End Date Taking? Authorizing Provider  ibuprofen (ADVIL,MOTRIN) 200 MG tablet Take 200 mg by mouth every 6 (six) hours as needed for moderate pain.   Yes [provider]  nitroGLYCERIN (NITROSTAT) 0.4 MG SL tablet Place 1 tablet (0.4 mg total) under the tongue every 5 (five) minutes as needed for chest pain. 08/28/13  Yes Carlena Bjornstad, MD  propranolol (INDERAL) 40 MG tablet Take 60 mg by mouth 2 (two) times daily. States he increased himself due to swelling in his ankles.   Yes [provider]  XARELTO 15 MG TABS tablet Take 15 mg by mouth daily. 12/13/16  Yes [provider]  triamcinolone (KENALOG) 0.025 % ointment Apply 1 application topically 2 (two) times daily. Do not apply to face 12/20/16   Noemi Chapel, MD    Family History No family history on file.  Social History Social History  Substance Use Topics  . Smoking status: Never Smoker  . Smokeless tobacco: Never Used  . Alcohol use No     Allergies   Rosuvastatin   Review of Systems Review of Systems  Constitutional: Negative for fever.  Skin: Positive for rash.     Physical Exam Updated Vital Signs BP (!) 163/84 (BP Location: Right Arm)   Pulse 70   Temp 97.7 F (36.5 C) (Oral)   Resp 17   Ht 5\' 11"  (1.803 m)   Wt 84.8 kg (187 lb)   SpO2 97%   BMI 26.08 kg/m   Physical Exam  Constitutional: He appears well-developed and well-nourished.  HENT:  Head: Normocephalic and atraumatic.  Eyes: Conjunctivae are normal. Right  eye exhibits no discharge. Left eye exhibits no discharge.  Cardiovascular: Normal rate and regular rhythm.   Pulmonary/Chest: Effort normal. No respiratory distress.  Pacemaker palpated in the left upper chest, no overlying redness or tenderness  Abdominal: Soft. There is no tenderness.  Musculoskeletal: He exhibits edema ( Mild symmetrical lower extremity edema at the ankles).  Neurological: He is alert. Coordination normal.  Skin: Skin is warm and dry. Rash noted. He is not diaphoretic. No erythema.  Papular red rash isolated spots intermittently dispersed and scattered across the arms and legs and lower back. Total is less than 20 areas, no urticaria, no petechiae or purpura, no vesicles pustules or macules, skin entirely covered in calamine from neck through the toes  Psychiatric: He has a normal mood and affect.  Nursing note and vitals reviewed.    ED Treatments / Results  Labs (all labs ordered are listed, but only abnormal results are displayed) Labs Reviewed - No data to display   Radiology No results found.  Procedures Procedures (including critical care time)  Medications Ordered in ED Medications  triamcinolone cream (KENALOG) 0.1 % (not administered)     Initial Impression / Assessment and Plan / ED Course  I have reviewed the triage vital signs and the nursing notes.  Pertinent labs & imaging results that were available during my care of the patient were reviewed by me and considered in my medical decision making (see chart for details).     The patient is well-appearing other than what appears to be a rash consistent with insect bites, consider chigger bites, no signs of disseminated zoster, this rash is different. The patient is well-appearing and has improvement with calamine. We'll prescribe some topical triamcinolone, the patient and his 2 sons were instructed on how to use this. He will need to follow-up closely.  Final Clinical Impressions(s) / ED  Diagnoses   Final diagnoses:  Rash    New Prescriptions New Prescriptions   TRIAMCINOLONE (KENALOG) 0.025 % OINTMENT    Apply 1 application topically 2 (two) times daily. Do not apply to face     Noemi Chapel, MD 12/20/16 1515

## 2016-12-28 ENCOUNTER — Encounter: Payer: Self-pay | Admitting: Internal Medicine

## 2016-12-28 DIAGNOSIS — I4891 Unspecified atrial fibrillation: Secondary | ICD-10-CM | POA: Diagnosis not present

## 2016-12-28 DIAGNOSIS — I441 Atrioventricular block, second degree: Secondary | ICD-10-CM | POA: Diagnosis not present

## 2017-01-19 DIAGNOSIS — I4891 Unspecified atrial fibrillation: Secondary | ICD-10-CM | POA: Diagnosis not present

## 2017-01-19 DIAGNOSIS — I10 Essential (primary) hypertension: Secondary | ICD-10-CM | POA: Diagnosis not present

## 2017-02-22 DIAGNOSIS — I10 Essential (primary) hypertension: Secondary | ICD-10-CM | POA: Diagnosis not present

## 2017-02-22 DIAGNOSIS — I4891 Unspecified atrial fibrillation: Secondary | ICD-10-CM | POA: Diagnosis not present

## 2017-02-23 DIAGNOSIS — Z23 Encounter for immunization: Secondary | ICD-10-CM | POA: Diagnosis not present

## 2017-03-23 DIAGNOSIS — I4891 Unspecified atrial fibrillation: Secondary | ICD-10-CM | POA: Diagnosis not present

## 2017-03-23 DIAGNOSIS — I10 Essential (primary) hypertension: Secondary | ICD-10-CM | POA: Diagnosis not present

## 2017-04-01 ENCOUNTER — Encounter: Payer: Self-pay | Admitting: Internal Medicine

## 2017-04-01 ENCOUNTER — Ambulatory Visit (INDEPENDENT_AMBULATORY_CARE_PROVIDER_SITE_OTHER): Payer: Medicare Other | Admitting: Internal Medicine

## 2017-04-01 ENCOUNTER — Telehealth: Payer: Self-pay | Admitting: Internal Medicine

## 2017-04-01 ENCOUNTER — Encounter: Payer: Self-pay | Admitting: *Deleted

## 2017-04-01 ENCOUNTER — Other Ambulatory Visit: Payer: Self-pay

## 2017-04-01 VITALS — BP 167/74 | HR 64 | Ht 70.0 in | Wt 183.0 lb

## 2017-04-01 DIAGNOSIS — I441 Atrioventricular block, second degree: Secondary | ICD-10-CM | POA: Diagnosis not present

## 2017-04-01 DIAGNOSIS — R5383 Other fatigue: Secondary | ICD-10-CM | POA: Diagnosis not present

## 2017-04-01 DIAGNOSIS — Z01812 Encounter for preprocedural laboratory examination: Secondary | ICD-10-CM

## 2017-04-01 DIAGNOSIS — I480 Paroxysmal atrial fibrillation: Secondary | ICD-10-CM | POA: Diagnosis not present

## 2017-04-01 NOTE — Patient Instructions (Signed)
Medication Instructions:   Stop Aspirin.  Continue all other medications.    Labwork:  BMET, CBC - orders given today.   Office will contact with results via phone or letter.    Testing/Procedures: Generator change.  Follow-Up: To be given at time of discharge from procedure.    Any Other Special Instructions Will Be Listed Below (If Applicable).  If you need a refill on your cardiac medications before your next appointment, please call your pharmacy.

## 2017-04-01 NOTE — Progress Notes (Signed)
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  Since last being seen in our clinic, the patient reports doing very well.  Today, he denies symptoms of palpitations, chest pain, shortness of breath,  lower extremity edema, dizziness, presyncope, or syncope.  The patient is otherwise without complaint today.   Past Medical History:  Diagnosis Date  . Atrial flutter [facility])    s/p CTI ablation October 2010 by Dr Caryl Comes  . Atrioventricular block, type II    s/p PPM  . BPH (benign prostatic hyperplasia)   . CAD (coronary artery disease)    s/p CABG  . Ejection fraction    45%..echo.Marland KitchenMarland KitchenFebruary,2010/ EF 60%....TEE.Marland KitchenMarland KitchenOctober,2010  . Hx of CABG   . Hyperlipemia   . Hypertension   . Mitral regurgitation    mild...echo.Marland KitchenMarland KitchenFebruary 2010  . Pacemaker   . Paroxysmal atrial fibrillation (HCC)   . PFO (patent foramen ovale)    small....TEE....October,2010  . Prostate cancer ([place])   . Pulmonary emboli ([place])    on xarelto   Past Surgical History:  Procedure Laterality Date  . ATRIAL ABLATION SURGERY  2010   CTI ablation for atrial flutter by Dr Caryl Comes 2010  . BACK SURGERY    . CARDIAC PACEMAKER PLACEMENT  06/21/2008   St. Jude dual-chamber permanent pacemaker  . CORONARY ARTERY BYPASS GRAFT  06/17/2008   5 vessel  . INGUINAL HERNIA REPAIR     RIGHT  . LEFT LOWER EXTREMITY REPAIR     from trauma in the distant past secondary to a fall  . TOTAL HIP ARTHROPLASTY    . TOTAL KNEE ARTHROPLASTY      ROS- all systems are reviewed and negative except as per HPI above  Current Outpatient Medications  Medication Sig Dispense Refill  . aspirin 325 MG tablet Take 325 mg by mouth daily.    [name] ibuprofen (ADVIL,MOTRIN) 200 MG tablet Take 200 mg by mouth every 6 (six) hours as needed for moderate pain.    . nitroGLYCERIN (NITROSTAT) 0.4 MG SL tablet Place 1 tablet (0.4 mg total) under the tongue every 5  (five) minutes as needed for chest pain. 25 tablet 3  . propranolol (INDERAL) 40 MG tablet Take 60 mg by mouth 2 (two) times daily. States he increased himself due to swelling in his ankles.    [name] triamcinolone (KENALOG) 0.025 % ointment Apply 1 application topically 2 (two) times daily. Do not apply to face 15 g 1  . XARELTO 15 MG TABS tablet Take 15 mg by mouth daily.  4   No current facility-administered medications for this visit.     Physical Exam: Vitals:   04/01/17 0957  BP: (!) 167/74  Pulse: 64  SpO2: 97%  Weight: 183 lb (83 kg)  Height: 5\' 10"  (1.778 m)    GEN- The patient is well appearing, alert and oriented x 3 today.   Head- normocephalic, atraumatic Eyes-  Sclera clear, conjunctiva pink Ears- hearing intact Oropharynx- clear Lungs- Clear to ausculation bilaterally, normal work of breathing Chest- pacemaker pocket is well healed Heart- Regular rate and rhythm, no murmurs, rubs or gallops, PMI not laterally displaced GI- soft, NT, ND, + BS Extremities- no clubbing, cyanosis, or edema  Pacemaker interrogation- reviewed in detail today,  See PACEART report   Assessment and Plan:  1. Symptomatic complete heart block Normal pacemaker function, though he reached ERI in September.  Risks, benefits, and alternatives to PPM pulse generator replacement were discussed in detail today.  The patient understands that risks include but are not limited to bleeding, infection, pneumothorax, perforation, tamponade, vascular damage, renal failure, MI, stroke, death, damage to his existing leads, and lead dislodgement and wishes to proceed.  We will therefore schedule the procedure at the next available time.  See Pace Art report No changes today  2. Atrial fibrillation Hold xarelto 2 days prior to generator change  3. CAD Stable Stop ASA  4. Hypertension Stable No change required today   [name] MD, [facility] 04/01/2017 10:20 AM

## 2017-04-01 NOTE — Telephone Encounter (Signed)
Pre-cert Verification for the following procedure    gen change - 12/13 - 9:00 - JA

## 2017-04-01 NOTE — H&P (View-Only) (Signed)
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  Since last being seen in our clinic, the patient reports doing very well.  Today, he denies symptoms of palpitations, chest pain, shortness of breath,  lower extremity edema, dizziness, presyncope, or syncope.  The patient is otherwise without complaint today.   Past Medical History:  Diagnosis Date  . Atrial flutter [facility])    s/p CTI ablation October 2010 by Dr Caryl Comes  . Atrioventricular block, type II    s/p PPM  . BPH (benign prostatic hyperplasia)   . CAD (coronary artery disease)    s/p CABG  . Ejection fraction    45%..echo.Marland KitchenMarland KitchenFebruary,2010/ EF 60%....TEE.Marland KitchenMarland KitchenOctober,2010  . Hx of CABG   . Hyperlipemia   . Hypertension   . Mitral regurgitation    mild...echo.Marland KitchenMarland KitchenFebruary 2010  . Pacemaker   . Paroxysmal atrial fibrillation (HCC)   . PFO (patent foramen ovale)    small....TEE....October,2010  . Prostate cancer ([place])   . Pulmonary emboli ([place])    on xarelto   Past Surgical History:  Procedure Laterality Date  . ATRIAL ABLATION SURGERY  2010   CTI ablation for atrial flutter by Dr Caryl Comes 2010  . BACK SURGERY    . CARDIAC PACEMAKER PLACEMENT  06/21/2008   St. Jude dual-chamber permanent pacemaker  . CORONARY ARTERY BYPASS GRAFT  06/17/2008   5 vessel  . INGUINAL HERNIA REPAIR     RIGHT  . LEFT LOWER EXTREMITY REPAIR     from trauma in the distant past secondary to a fall  . TOTAL HIP ARTHROPLASTY    . TOTAL KNEE ARTHROPLASTY      ROS- all systems are reviewed and negative except as per HPI above  Current Outpatient Medications  Medication Sig Dispense Refill  . aspirin 325 MG tablet Take 325 mg by mouth daily.    [name] ibuprofen (ADVIL,MOTRIN) 200 MG tablet Take 200 mg by mouth every 6 (six) hours as needed for moderate pain.    . nitroGLYCERIN (NITROSTAT) 0.4 MG SL tablet Place 1 tablet (0.4 mg total) under the tongue every 5  (five) minutes as needed for chest pain. 25 tablet 3  . propranolol (INDERAL) 40 MG tablet Take 60 mg by mouth 2 (two) times daily. States he increased himself due to swelling in his ankles.    [name] triamcinolone (KENALOG) 0.025 % ointment Apply 1 application topically 2 (two) times daily. Do not apply to face 15 g 1  . XARELTO 15 MG TABS tablet Take 15 mg by mouth daily.  4   No current facility-administered medications for this visit.     Physical Exam: Vitals:   04/01/17 0957  BP: (!) 167/74  Pulse: 64  SpO2: 97%  Weight: 183 lb (83 kg)  Height: 5\' 10"  (1.778 m)    GEN- The patient is well appearing, alert and oriented x 3 today.   Head- normocephalic, atraumatic Eyes-  Sclera clear, conjunctiva pink Ears- hearing intact Oropharynx- clear Lungs- Clear to ausculation bilaterally, normal work of breathing Chest- pacemaker pocket is well healed Heart- Regular rate and rhythm, no murmurs, rubs or gallops, PMI not laterally displaced GI- soft, NT, ND, + BS Extremities- no clubbing, cyanosis, or edema  Pacemaker interrogation- reviewed in detail today,  See PACEART report   Assessment and Plan:  1. Symptomatic complete heart block Normal pacemaker function, though he reached ERI in September.  Risks, benefits, and alternatives to PPM pulse generator replacement were discussed in detail today.  The patient understands that risks include but are not limited to bleeding, infection, pneumothorax, perforation, tamponade, vascular damage, renal failure, MI, stroke, death, damage to his existing leads, and lead dislodgement and wishes to proceed.  We will therefore schedule the procedure at the next available time.  See Pace Art report No changes today  2. Atrial fibrillation Hold xarelto 2 days prior to generator change  3. CAD Stable Stop ASA  4. Hypertension Stable No change required today   [name] MD, [facility] 04/01/2017 10:20 AM

## 2017-04-04 LAB — CUP PACEART INCLINIC DEVICE CHECK
Date Time Interrogation Session: 20181210114446
Implantable Lead Implant Date: 20100226
Implantable Lead Location: 753859
MDC IDC LEAD IMPLANT DT: 20100226
MDC IDC LEAD LOCATION: 753860
MDC IDC PG IMPLANT DT: 20100226
MDC IDC PG SERIAL: 2238486

## 2017-04-07 ENCOUNTER — Encounter (HOSPITAL_COMMUNITY)
Admission: RE | Disposition: A | Discharge status: Home / Self Care | Payer: Self-pay | Source: Ambulatory Visit | Attending: Internal Medicine

## 2017-04-07 ENCOUNTER — Ambulatory Visit (HOSPITAL_COMMUNITY)
Admission: RE | Admit: 2017-04-07 | Discharge: 2017-04-07 | Disposition: A | Discharge status: Home / Self Care | Payer: Medicare Other | Source: Ambulatory Visit | Attending: Internal Medicine | Admitting: Internal Medicine

## 2017-04-07 ENCOUNTER — Encounter (HOSPITAL_COMMUNITY): Payer: Self-pay

## 2017-04-07 DIAGNOSIS — Z951 Presence of aortocoronary bypass graft: Secondary | ICD-10-CM | POA: Insufficient documentation

## 2017-04-07 DIAGNOSIS — Z79899 Other long term (current) drug therapy: Secondary | ICD-10-CM | POA: Diagnosis not present

## 2017-04-07 DIAGNOSIS — I480 Paroxysmal atrial fibrillation: Secondary | ICD-10-CM | POA: Diagnosis not present

## 2017-04-07 DIAGNOSIS — Z45010 Encounter for checking and testing of cardiac pacemaker pulse generator [battery]: Secondary | ICD-10-CM | POA: Insufficient documentation

## 2017-04-07 DIAGNOSIS — I10 Essential (primary) hypertension: Secondary | ICD-10-CM | POA: Diagnosis not present

## 2017-04-07 DIAGNOSIS — Z96649 Presence of unspecified artificial hip joint: Secondary | ICD-10-CM | POA: Diagnosis not present

## 2017-04-07 DIAGNOSIS — Z8546 Personal history of malignant neoplasm of prostate: Secondary | ICD-10-CM | POA: Diagnosis not present

## 2017-04-07 DIAGNOSIS — Z96659 Presence of unspecified artificial knee joint: Secondary | ICD-10-CM | POA: Insufficient documentation

## 2017-04-07 DIAGNOSIS — Z7982 Long term (current) use of aspirin: Secondary | ICD-10-CM | POA: Insufficient documentation

## 2017-04-07 DIAGNOSIS — Z86711 Personal history of pulmonary embolism: Secondary | ICD-10-CM | POA: Insufficient documentation

## 2017-04-07 DIAGNOSIS — I2510 Atherosclerotic heart disease of native coronary artery without angina pectoris: Secondary | ICD-10-CM | POA: Insufficient documentation

## 2017-04-07 DIAGNOSIS — I442 Atrioventricular block, complete: Secondary | ICD-10-CM | POA: Diagnosis not present

## 2017-04-07 DIAGNOSIS — I495 Sick sinus syndrome: Secondary | ICD-10-CM | POA: Insufficient documentation

## 2017-04-07 DIAGNOSIS — Z7901 Long term (current) use of anticoagulants: Secondary | ICD-10-CM | POA: Insufficient documentation

## 2017-04-07 DIAGNOSIS — Z791 Long term (current) use of non-steroidal anti-inflammatories (NSAID): Secondary | ICD-10-CM | POA: Insufficient documentation

## 2017-04-07 DIAGNOSIS — I441 Atrioventricular block, second degree: Secondary | ICD-10-CM | POA: Diagnosis not present

## 2017-04-07 HISTORY — PX: PPM GENERATOR CHANGEOUT: EP1233

## 2017-04-07 LAB — CBC
HCT: 41.9 % (ref 39.0–52.0)
Hemoglobin: 13.9 g/dL (ref 13.0–17.0)
MCH: 32.4 pg (ref 26.0–34.0)
MCHC: 33.2 g/dL (ref 30.0–36.0)
MCV: 97.7 fL (ref 78.0–100.0)
PLATELETS: 175 10*3/uL (ref 150–400)
RBC: 4.29 MIL/uL (ref 4.22–5.81)
RDW: 12.8 % (ref 11.5–15.5)
WBC: 7.2 10*3/uL (ref 4.0–10.5)

## 2017-04-07 LAB — BASIC METABOLIC PANEL
Anion gap: 6 (ref 5–15)
BUN: 24 mg/dL — AB (ref 6–20)
CALCIUM: 9.1 mg/dL (ref 8.9–10.3)
CHLORIDE: 102 mmol/L (ref 101–111)
CO2: 28 mmol/L (ref 22–32)
CREATININE: 1.11 mg/dL (ref 0.61–1.24)
GFR calc non Af Amer: 56 mL/min — ABNORMAL LOW (ref >60)
Glucose, Bld: 92 mg/dL (ref 65–99)
Potassium: 4.6 mmol/L (ref 3.5–5.1)
SODIUM: 136 mmol/L (ref 135–145)

## 2017-04-07 LAB — SURGICAL PCR SCREEN
MRSA, PCR: NEGATIVE
Staphylococcus aureus: NEGATIVE

## 2017-04-07 SURGERY — PPM GENERATOR CHANGEOUT

## 2017-04-07 MED ORDER — LIDOCAINE HCL (PF) 1 % IJ SOLN
INTRAMUSCULAR | Status: DC | PRN
  Administered 2017-04-07: 60 mL

## 2017-04-07 MED ORDER — LIDOCAINE HCL (PF) 1 % IJ SOLN
INTRAMUSCULAR | Status: AC
  Filled 2017-04-07: qty 60

## 2017-04-07 MED ORDER — ONDANSETRON HCL 4 MG/2ML IJ SOLN: 4.0000 mg | Freq: Four times a day (QID) | INTRAMUSCULAR | Status: DC | PRN

## 2017-04-07 MED ORDER — SODIUM CHLORIDE 0.9 % IR SOLN
Status: AC
  Filled 2017-04-07: qty 2

## 2017-04-07 MED ORDER — SODIUM CHLORIDE 0.9% FLUSH: 3.0000 mL | INTRAVENOUS | Status: DC | PRN

## 2017-04-07 MED ORDER — SODIUM CHLORIDE 0.9 % IV SOLN
INTRAVENOUS | Status: DC
  Administered 2017-04-07: 09:00:00 via INTRAVENOUS

## 2017-04-07 MED ORDER — CHLORHEXIDINE GLUCONATE 4 % EX LIQD
60.0000 mL | Freq: Once | CUTANEOUS | Status: DC
  Filled 2017-04-07: qty 60

## 2017-04-07 MED ORDER — CEFAZOLIN SODIUM-DEXTROSE 2-4 GM/100ML-% IV SOLN
INTRAVENOUS | Status: AC
  Filled 2017-04-07: qty 100

## 2017-04-07 MED ORDER — SODIUM CHLORIDE 0.9 % IV SOLN: 250.0000 mL | INTRAVENOUS | Status: DC | PRN

## 2017-04-07 MED ORDER — ACETAMINOPHEN 325 MG PO TABS
325.0000 mg | ORAL_TABLET | ORAL | Status: DC | PRN
  Filled 2017-04-07: qty 2

## 2017-04-07 MED ORDER — GENTAMICIN SULFATE 40 MG/ML IJ SOLN
80.0000 mg | INTRAMUSCULAR | Status: AC
  Administered 2017-04-07: 80 mg
  Filled 2017-04-07: qty 2

## 2017-04-07 MED ORDER — CEFAZOLIN SODIUM-DEXTROSE 2-4 GM/100ML-% IV SOLN
2.0000 g | INTRAVENOUS | Status: AC
  Administered 2017-04-07: 2 g via INTRAVENOUS
  Filled 2017-04-07: qty 100

## 2017-04-07 MED ORDER — MUPIROCIN 2 % EX OINT
TOPICAL_OINTMENT | CUTANEOUS | Status: AC
  Filled 2017-04-07: qty 22

## 2017-04-07 MED ORDER — SODIUM CHLORIDE 0.9% FLUSH: 3.0000 mL | Freq: Two times a day (BID) | INTRAVENOUS | Status: DC

## 2017-04-07 SURGICAL SUPPLY — 4 items
CABLE SURGICAL S-101-97-12 (CABLE) ×2 IMPLANT
PACEMAKER ASSURITY DR-RF (Pacemaker) ×2 IMPLANT
PAD DEFIB LIFELINK (PAD) ×2 IMPLANT
TRAY PACEMAKER INSERTION (PACKS) ×2 IMPLANT

## 2017-04-07 NOTE — Discharge Instructions (Signed)

## 2017-04-07 NOTE — Interval H&P Note (Signed)
History and Physical Interval Note:  04/07/2017 8:16 AM  Stephen Olsen  has presented today for surgery, with the diagnosis of i  The various methods of treatment have been discussed with the patient and family. After consideration of risks, benefits and other options for treatment, the patient has consented to  Procedure(s): PPM GENERATOR CHANGEOUT (N/A) as a surgical intervention .  The patient's history has been reviewed, patient examined, no change in status, stable for surgery.  I have reviewed the patient's chart and labs.  Questions were answered to the patient's satisfaction.    Pt at [facility] since September.  Now presents for PPM generator change. Risks, benefits, and alternatives to PPM pulse generator replacement were discussed in detail today with patient and his son.  The patient understands that risks include but are not limited to bleeding, infection, pneumothorax, perforation, tamponade, vascular damage, renal failure, MI, stroke, death, damage to his existing leads, and lead dislodgement and wishes to proceed.  We will therefore schedule the procedure at this time.   [name]

## 2017-04-08 DIAGNOSIS — I4891 Unspecified atrial fibrillation: Secondary | ICD-10-CM | POA: Diagnosis not present

## 2017-04-08 DIAGNOSIS — I10 Essential (primary) hypertension: Secondary | ICD-10-CM | POA: Diagnosis not present

## 2017-04-13 DIAGNOSIS — Z125 Encounter for screening for malignant neoplasm of prostate: Secondary | ICD-10-CM | POA: Diagnosis not present

## 2017-04-13 DIAGNOSIS — Z789 Other specified health status: Secondary | ICD-10-CM | POA: Diagnosis not present

## 2017-04-13 DIAGNOSIS — R5383 Other fatigue: Secondary | ICD-10-CM | POA: Diagnosis not present

## 2017-04-13 DIAGNOSIS — Z1339 Encounter for screening examination for other mental health and behavioral disorders: Secondary | ICD-10-CM | POA: Diagnosis not present

## 2017-04-13 DIAGNOSIS — Z1211 Encounter for screening for malignant neoplasm of colon: Secondary | ICD-10-CM | POA: Diagnosis not present

## 2017-04-13 DIAGNOSIS — Z0000 Encounter for general adult medical examination without abnormal findings: Secondary | ICD-10-CM | POA: Diagnosis not present

## 2017-04-13 DIAGNOSIS — Z1331 Encounter for screening for depression: Secondary | ICD-10-CM | POA: Diagnosis not present

## 2017-04-13 DIAGNOSIS — Z79899 Other long term (current) drug therapy: Secondary | ICD-10-CM | POA: Diagnosis not present

## 2017-04-13 DIAGNOSIS — Z6827 Body mass index (BMI) 27.0-27.9, adult: Secondary | ICD-10-CM | POA: Diagnosis not present

## 2017-04-13 DIAGNOSIS — Z299 Encounter for prophylactic measures, unspecified: Secondary | ICD-10-CM | POA: Diagnosis not present

## 2017-04-13 DIAGNOSIS — E7800 Pure hypercholesterolemia, unspecified: Secondary | ICD-10-CM | POA: Diagnosis not present

## 2017-04-13 DIAGNOSIS — Z7189 Other specified counseling: Secondary | ICD-10-CM | POA: Diagnosis not present

## 2017-04-15 ENCOUNTER — Ambulatory Visit (INDEPENDENT_AMBULATORY_CARE_PROVIDER_SITE_OTHER): Payer: Medicare Other | Admitting: *Deleted

## 2017-04-15 DIAGNOSIS — I441 Atrioventricular block, second degree: Secondary | ICD-10-CM | POA: Diagnosis not present

## 2017-04-15 DIAGNOSIS — I480 Paroxysmal atrial fibrillation: Secondary | ICD-10-CM

## 2017-04-15 LAB — CUP PACEART INCLINIC DEVICE CHECK
Battery Remaining Longevity: 99 mo
Battery Voltage: 2.98 V
Brady Statistic RA Percent Paced: 97 %
Brady Statistic RV Percent Paced: 99.92 %
Date Time Interrogation Session: 20181221104758
Implantable Lead Implant Date: 20100226
Implantable Lead Location: 753860
Lead Channel Impedance Value: 412.5 Ohm
Lead Channel Pacing Threshold Amplitude: 0.75 V
Lead Channel Pacing Threshold Amplitude: 0.75 V
Lead Channel Pacing Threshold Pulse Width: 0.4 ms
Lead Channel Pacing Threshold Pulse Width: 0.4 ms
Lead Channel Setting Pacing Pulse Width: 0.4 ms
MDC IDC LEAD IMPLANT DT: 20100226
MDC IDC LEAD LOCATION: 753859
MDC IDC MSMT LEADCHNL RA SENSING INTR AMPL: 2 mV
MDC IDC MSMT LEADCHNL RV IMPEDANCE VALUE: 412.5 Ohm
MDC IDC MSMT LEADCHNL RV PACING THRESHOLD AMPLITUDE: 1 V
MDC IDC MSMT LEADCHNL RV PACING THRESHOLD AMPLITUDE: 1 V
MDC IDC MSMT LEADCHNL RV PACING THRESHOLD PULSEWIDTH: 0.4 ms
MDC IDC MSMT LEADCHNL RV PACING THRESHOLD PULSEWIDTH: 0.4 ms
MDC IDC PG IMPLANT DT: 20181213
MDC IDC SET LEADCHNL RA PACING AMPLITUDE: 2 V
MDC IDC SET LEADCHNL RV PACING AMPLITUDE: 2.5 V
MDC IDC SET LEADCHNL RV SENSING SENSITIVITY: 4 mV
Pulse Gen Model: 2272
Pulse Gen Serial Number: 8958146

## 2017-04-15 NOTE — Progress Notes (Signed)
Wound check appointment. Steri-strips removed. Wound without redness or edema. Incision edges approximated, wound well healed. Area around incision is irritated from the bandage- I instructed the patient to treat the irritated area with hydrocortisone cream or an unscented lotion or ointment. Normal device function. Thresholds, sensing, and impedances consistent with implant measurements. Outputs remain at chronic settings d/t generator replacement only 04/07/17. Histogram distribution appropriate for patient and level of activity. No mode switches or high ventricular rates noted. Patient educated about wound care, arm mobility, and Merlin monitoring. Merlin 07/18/17, ROV with JA in [place] 08/26/17.

## 2017-05-02 DIAGNOSIS — I4891 Unspecified atrial fibrillation: Secondary | ICD-10-CM | POA: Diagnosis not present

## 2017-05-02 DIAGNOSIS — I10 Essential (primary) hypertension: Secondary | ICD-10-CM | POA: Diagnosis not present

## 2017-07-18 ENCOUNTER — Ambulatory Visit (INDEPENDENT_AMBULATORY_CARE_PROVIDER_SITE_OTHER): Payer: Medicare Other | Admitting: *Deleted

## 2017-07-18 DIAGNOSIS — I441 Atrioventricular block, second degree: Secondary | ICD-10-CM | POA: Diagnosis not present

## 2017-07-18 NOTE — Progress Notes (Signed)
Remote pacemaker transmission.   

## 2017-07-19 ENCOUNTER — Encounter: Payer: Self-pay | Admitting: Cardiology

## 2017-07-19 LAB — CUP PACEART REMOTE DEVICE CHECK
Battery Remaining Percentage: 95.5 %
Battery Voltage: 2.99 V
Brady Statistic AP VP Percent: 99 %
Brady Statistic AS VS Percent: 1 %
Brady Statistic RA Percent Paced: 99 %
Implantable Lead Implant Date: 20100226
Implantable Lead Location: 753859
Implantable Pulse Generator Implant Date: 20181213
Lead Channel Impedance Value: 390 Ohm
Lead Channel Pacing Threshold Amplitude: 0.75 V
Lead Channel Pacing Threshold Pulse Width: 0.4 ms
Lead Channel Sensing Intrinsic Amplitude: 2.5 mV
Lead Channel Setting Pacing Amplitude: 2.5 V
Lead Channel Setting Pacing Pulse Width: 0.4 ms
MDC IDC LEAD IMPLANT DT: 20100226
MDC IDC LEAD LOCATION: 753860
MDC IDC MSMT BATTERY REMAINING LONGEVITY: 101 mo
MDC IDC MSMT LEADCHNL RA PACING THRESHOLD PULSEWIDTH: 0.4 ms
MDC IDC MSMT LEADCHNL RV IMPEDANCE VALUE: 410 Ohm
MDC IDC MSMT LEADCHNL RV PACING THRESHOLD AMPLITUDE: 1 V
MDC IDC PG SERIAL: 8958146
MDC IDC SESS DTM: 20190325113216
MDC IDC SET LEADCHNL RA PACING AMPLITUDE: 2 V
MDC IDC SET LEADCHNL RV SENSING SENSITIVITY: 4 mV
MDC IDC STAT BRADY AP VS PERCENT: 1 %
MDC IDC STAT BRADY AS VP PERCENT: 1 %
MDC IDC STAT BRADY RV PERCENT PACED: 99 %

## 2017-07-21 DIAGNOSIS — I10 Essential (primary) hypertension: Secondary | ICD-10-CM | POA: Diagnosis not present

## 2017-07-21 DIAGNOSIS — I4891 Unspecified atrial fibrillation: Secondary | ICD-10-CM | POA: Diagnosis not present

## 2017-08-26 ENCOUNTER — Ambulatory Visit (INDEPENDENT_AMBULATORY_CARE_PROVIDER_SITE_OTHER): Payer: Medicare Other | Admitting: Internal Medicine

## 2017-08-26 ENCOUNTER — Encounter: Payer: Self-pay | Admitting: Internal Medicine

## 2017-08-26 ENCOUNTER — Other Ambulatory Visit: Payer: Self-pay

## 2017-08-26 VITALS — BP 182/97 | HR 65 | Wt 187.0 lb

## 2017-08-26 DIAGNOSIS — I10 Essential (primary) hypertension: Secondary | ICD-10-CM | POA: Diagnosis not present

## 2017-08-26 DIAGNOSIS — I480 Paroxysmal atrial fibrillation: Secondary | ICD-10-CM | POA: Diagnosis not present

## 2017-08-26 DIAGNOSIS — I4891 Unspecified atrial fibrillation: Secondary | ICD-10-CM | POA: Diagnosis not present

## 2017-08-26 DIAGNOSIS — I441 Atrioventricular block, second degree: Secondary | ICD-10-CM

## 2017-08-26 LAB — CUP PACEART INCLINIC DEVICE CHECK
Battery Voltage: 2.98 V
Brady Statistic RV Percent Paced: 99.76 %
Date Time Interrogation Session: 20190503125926
Implantable Lead Implant Date: 20100226
Implantable Lead Implant Date: 20100226
Implantable Lead Location: 753860
Lead Channel Impedance Value: 437.5 Ohm
Lead Channel Pacing Threshold Amplitude: 1 V
Lead Channel Pacing Threshold Pulse Width: 0.4 ms
Lead Channel Pacing Threshold Pulse Width: 0.4 ms
Lead Channel Sensing Intrinsic Amplitude: 2.5 mV
Lead Channel Setting Pacing Amplitude: 2 V
Lead Channel Setting Pacing Pulse Width: 0.4 ms
Lead Channel Setting Sensing Sensitivity: 4 mV
MDC IDC LEAD LOCATION: 753859
MDC IDC MSMT BATTERY REMAINING LONGEVITY: 97 mo
MDC IDC MSMT LEADCHNL RA IMPEDANCE VALUE: 425 Ohm
MDC IDC MSMT LEADCHNL RA PACING THRESHOLD AMPLITUDE: 0.75 V
MDC IDC MSMT LEADCHNL RA PACING THRESHOLD AMPLITUDE: 0.75 V
MDC IDC MSMT LEADCHNL RA PACING THRESHOLD PULSEWIDTH: 0.4 ms
MDC IDC MSMT LEADCHNL RV PACING THRESHOLD AMPLITUDE: 1 V
MDC IDC MSMT LEADCHNL RV PACING THRESHOLD PULSEWIDTH: 0.4 ms
MDC IDC PG IMPLANT DT: 20181213
MDC IDC SET LEADCHNL RV PACING AMPLITUDE: 2.5 V
MDC IDC STAT BRADY RA PERCENT PACED: 99 %
Pulse Gen Model: 2272
Pulse Gen Serial Number: 8958146

## 2017-08-26 NOTE — Progress Notes (Signed)
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  Since his recent PPM generator change, the patient reports doing very well.  Today, he denies symptoms of palpitations, chest pain, shortness of breath,  dizziness, presyncope, or syncope.  + stable edema.  The patient is otherwise without complaint today.   Past Medical History:  Diagnosis Date  . Atrial flutter [facility])    s/p CTI ablation October 2010 by Dr Caryl Comes  . Atrioventricular block, type II    s/p PPM  . BPH (benign prostatic hyperplasia)   . CAD (coronary artery disease)    s/p CABG  . Ejection fraction    45%..echo.Marland KitchenMarland KitchenFebruary,2010/ EF 60%....TEE.Marland KitchenMarland KitchenOctober,2010  . Hx of CABG   . Hyperlipemia   . Hypertension   . Mitral regurgitation    mild...echo.Marland KitchenMarland KitchenFebruary 2010  . Pacemaker   . Paroxysmal atrial fibrillation (HCC)   . PFO (patent foramen ovale)    small....TEE....October,2010  . Prostate cancer ([place])   . Pulmonary emboli ([place])    on xarelto   Past Surgical History:  Procedure Laterality Date  . ATRIAL ABLATION SURGERY  2010   CTI ablation for atrial flutter by Dr Caryl Comes 2010  . BACK SURGERY    . CARDIAC PACEMAKER PLACEMENT  06/21/2008   St. Jude dual-chamber permanent pacemaker  . CORONARY ARTERY BYPASS GRAFT  06/17/2008   5 vessel  . INGUINAL HERNIA REPAIR     RIGHT  . LEFT LOWER EXTREMITY REPAIR     from trauma in the distant past secondary to a fall  . PPM GENERATOR CHANGEOUT N/A 04/07/2017   Procedure: PPM GENERATOR CHANGEOUT;  Surgeon: [name], MD;  Location: [place] [facility];  Service: Cardiovascular;  Laterality: N/A;  . TOTAL HIP ARTHROPLASTY    . TOTAL KNEE ARTHROPLASTY      ROS- all systems are reviewed and negative except as per HPI above  Current Outpatient Medications  Medication Sig Dispense Refill  . acetaminophen (TYLENOL) 325 MG tablet Take 325 mg by mouth every 6 (six) hours as needed  for moderate pain or headache.    [name] aspirin 325 MG tablet Take 325 mg by mouth daily.    [name] ibuprofen (ADVIL,MOTRIN) 200 MG tablet Take 400 mg by mouth every 6 (six) hours as needed for moderate pain.     . nitroGLYCERIN (NITROSTAT) 0.4 MG SL tablet Place 1 tablet (0.4 mg total) under the tongue every 5 (five) minutes as needed for chest pain. 25 tablet 3  . propranolol (INDERAL) 40 MG tablet Take 60 mg by mouth daily.     [name] triamcinolone (KENALOG) 0.025 % ointment Apply 1 application topically 2 (two) times daily. Do not apply to face (Patient taking differently: Apply 1 application topically 2 (two) times daily as needed (for rash). Do not apply to face) 15 g 1  . XARELTO 15 MG TABS tablet Take 15 mg by mouth daily.  4   No current facility-administered medications for this visit.     Physical Exam: Vitals:   08/26/17 0858  BP: (!) 182/97  Pulse: 65  SpO2: 100%  Weight: 187 lb (84.8 kg)    GEN- The patient is well appearing, alert and oriented x 3 today.   Head- normocephalic, atraumatic Eyes-  Sclera clear, conjunctiva pink Ears- hearing intact Oropharynx- clear Lungs- Clear to ausculation bilaterally, normal work of breathing Chest- pacemaker pocket is well  healed Heart- Regular rate and rhythm, no murmurs, rubs or gallops, PMI not laterally displaced GI- soft, NT, ND, + BS Extremities- no clubbing, cyanosis, +1 edema  Pacemaker interrogation- reviewed in detail today,  See PACEART report   Assessment and Plan:  1. Symptomatic complete heart block Normal pacemaker function See Pace Art report No changes today  2. Atrial fibrillation On xarelto AF burden is 0% since generator change 12/18) Refuses to stop taking ASA (convinced it helps his BP--> see below)  3. CAD No ischemic symptoms No changes  4. HTN Elevated He is adamant that he will not take antihypertensive medicines.  He states "Dr Woody Seller tried to give me some of that medicine and I went home and threw it in  the trash can.  I am not taking any of that BP medicine."  He states "aspirin helps my blood pressure.  When I quit taking aspirin, my blood pressure went up.  I restarted it and my BP has been fine ever since".  Unfortunately, I cannot convince him to take antihypertensive medicine or to stop his ASA at this time.  I have had conversations with his son previously who is aware.  Merlin Return in a year  [name] MD, [facility] 08/26/2017 9:30 AM

## 2017-08-26 NOTE — Patient Instructions (Addendum)
Medication Instructions:   Your physician recommends that you continue on your current medications as directed. Please refer to the Current Medication list given to you today.  Labwork:  NONE  Testing/Procedures:  NONE  Follow-Up: Your physician recommends that you schedule a follow-up appointment in: 1 year with Dr. Rayann Heman. Please schedule this appointment today before leaving the office.   Any Other Special Instructions Will Be Listed Below (If Applicable).  Your next device check from home is on 10/17/17.  If you need a refill on your cardiac medications before your next appointment, please call your pharmacy.

## 2017-09-29 DIAGNOSIS — I10 Essential (primary) hypertension: Secondary | ICD-10-CM | POA: Diagnosis not present

## 2017-09-29 DIAGNOSIS — I4891 Unspecified atrial fibrillation: Secondary | ICD-10-CM | POA: Diagnosis not present

## 2017-10-17 ENCOUNTER — Telehealth: Payer: Self-pay

## 2017-10-17 ENCOUNTER — Encounter: Payer: Medicare Other | Admitting: *Deleted

## 2017-10-17 NOTE — Telephone Encounter (Signed)
LMOVM reminding pt to send remote transmission.   

## 2017-10-19 ENCOUNTER — Encounter: Payer: Self-pay | Admitting: Cardiology

## 2017-10-25 DIAGNOSIS — I4891 Unspecified atrial fibrillation: Secondary | ICD-10-CM | POA: Diagnosis not present

## 2017-10-25 DIAGNOSIS — I10 Essential (primary) hypertension: Secondary | ICD-10-CM | POA: Diagnosis not present

## 2017-11-18 ENCOUNTER — Encounter: Payer: Self-pay | Admitting: Cardiology

## 2017-11-25 DIAGNOSIS — I10 Essential (primary) hypertension: Secondary | ICD-10-CM | POA: Diagnosis not present

## 2017-11-25 DIAGNOSIS — I4891 Unspecified atrial fibrillation: Secondary | ICD-10-CM | POA: Diagnosis not present

## 2017-11-29 DIAGNOSIS — I441 Atrioventricular block, second degree: Secondary | ICD-10-CM

## 2017-12-28 DIAGNOSIS — I4891 Unspecified atrial fibrillation: Secondary | ICD-10-CM | POA: Diagnosis not present

## 2017-12-28 DIAGNOSIS — I10 Essential (primary) hypertension: Secondary | ICD-10-CM | POA: Diagnosis not present

## 2018-01-17 ENCOUNTER — Telehealth: Payer: Self-pay

## 2018-01-17 ENCOUNTER — Encounter: Payer: Medicare Other | Admitting: *Deleted

## 2018-01-17 NOTE — Telephone Encounter (Signed)
LMOVM reminding pt to send remote transmission.   

## 2018-01-18 ENCOUNTER — Encounter: Payer: Self-pay | Admitting: Cardiology

## 2018-01-24 ENCOUNTER — Telehealth: Payer: Self-pay

## 2018-01-24 DIAGNOSIS — Z23 Encounter for immunization: Secondary | ICD-10-CM | POA: Diagnosis not present

## 2018-01-24 NOTE — Telephone Encounter (Signed)
LVM for pt to call device clinic back to let him know that I called [employer] and DC TTMs

## 2018-02-01 DIAGNOSIS — I4891 Unspecified atrial fibrillation: Secondary | ICD-10-CM | POA: Diagnosis not present

## 2018-02-01 DIAGNOSIS — I10 Essential (primary) hypertension: Secondary | ICD-10-CM | POA: Diagnosis not present

## 2018-03-01 DIAGNOSIS — I10 Essential (primary) hypertension: Secondary | ICD-10-CM | POA: Diagnosis not present

## 2018-03-01 DIAGNOSIS — I4891 Unspecified atrial fibrillation: Secondary | ICD-10-CM | POA: Diagnosis not present

## 2018-03-30 DIAGNOSIS — I10 Essential (primary) hypertension: Secondary | ICD-10-CM | POA: Diagnosis not present

## 2018-03-30 DIAGNOSIS — I4891 Unspecified atrial fibrillation: Secondary | ICD-10-CM | POA: Diagnosis not present

## 2018-04-25 ENCOUNTER — Telehealth: Payer: Self-pay | Admitting: Internal Medicine

## 2018-04-25 NOTE — Telephone Encounter (Signed)
Opened in error

## 2018-04-25 NOTE — Telephone Encounter (Signed)
Left message for patient to remind of missed remote transmission.  

## 2018-04-28 ENCOUNTER — Encounter: Payer: Self-pay | Admitting: Cardiology

## 2018-04-28 DIAGNOSIS — I10 Essential (primary) hypertension: Secondary | ICD-10-CM | POA: Diagnosis not present

## 2018-04-28 DIAGNOSIS — I4891 Unspecified atrial fibrillation: Secondary | ICD-10-CM | POA: Diagnosis not present

## 2018-05-04 ENCOUNTER — Ambulatory Visit (INDEPENDENT_AMBULATORY_CARE_PROVIDER_SITE_OTHER): Payer: Medicare Other

## 2018-05-04 DIAGNOSIS — I441 Atrioventricular block, second degree: Secondary | ICD-10-CM

## 2018-05-04 DIAGNOSIS — I4892 Unspecified atrial flutter: Secondary | ICD-10-CM

## 2018-05-05 ENCOUNTER — Encounter: Payer: Self-pay | Admitting: Internal Medicine

## 2018-05-05 ENCOUNTER — Ambulatory Visit (INDEPENDENT_AMBULATORY_CARE_PROVIDER_SITE_OTHER): Payer: Medicare Other | Admitting: Internal Medicine

## 2018-05-05 VITALS — BP 151/86 | HR 61 | Ht 70.0 in | Wt 179.0 lb

## 2018-05-05 DIAGNOSIS — I441 Atrioventricular block, second degree: Secondary | ICD-10-CM

## 2018-05-05 DIAGNOSIS — Z950 Presence of cardiac pacemaker: Secondary | ICD-10-CM

## 2018-05-05 DIAGNOSIS — I480 Paroxysmal atrial fibrillation: Secondary | ICD-10-CM

## 2018-05-05 LAB — CUP PACEART INCLINIC DEVICE CHECK
Brady Statistic RA Percent Paced: 99.25 %
Brady Statistic RV Percent Paced: 99.87 %
Implantable Lead Implant Date: 20100226
Lead Channel Impedance Value: 362.5 Ohm
Lead Channel Impedance Value: 437.5 Ohm
Lead Channel Pacing Threshold Pulse Width: 0.4 ms
Lead Channel Pacing Threshold Pulse Width: 0.4 ms
Lead Channel Sensing Intrinsic Amplitude: 1.9 mV
Lead Channel Setting Sensing Sensitivity: 4 mV
MDC IDC LEAD IMPLANT DT: 20100226
MDC IDC LEAD LOCATION: 753859
MDC IDC LEAD LOCATION: 753860
MDC IDC MSMT BATTERY REMAINING LONGEVITY: 98 mo
MDC IDC MSMT BATTERY VOLTAGE: 2.98 V
MDC IDC MSMT LEADCHNL RA PACING THRESHOLD AMPLITUDE: 0.75 V
MDC IDC MSMT LEADCHNL RV PACING THRESHOLD AMPLITUDE: 1 V
MDC IDC PG IMPLANT DT: 20181213
MDC IDC SESS DTM: 20200110140650
MDC IDC SET LEADCHNL RA PACING AMPLITUDE: 2 V
MDC IDC SET LEADCHNL RV PACING AMPLITUDE: 2.5 V
MDC IDC SET LEADCHNL RV PACING PULSEWIDTH: 0.4 ms
Pulse Gen Serial Number: 8958146

## 2018-05-05 NOTE — Progress Notes (Signed)
Remote pacemaker transmission.   

## 2018-05-05 NOTE — Patient Instructions (Signed)
Medication Instructions:  Continue all current medications.  Labwork: none  Testing/Procedures: none  Follow-Up: 1 year   Any Other Special Instructions Will Be Listed Below (If Applicable).  Remote monitoring is used to monitor your Pacemaker of ICD from home. This monitoring reduces the number of office visits required to check your device to one time per year. It allows [place] to keep an eye on the functioning of your device to ensure it is working properly. You are scheduled for a device check from home on 08/03/2018. You may send your transmission at any time that day. If you have a wireless device, the transmission will be sent automatically. After your physician reviews your transmission, you will receive a postcard with your next transmission date.  Low sodium diet info sheet given today.   If you need a refill on your cardiac medications before your next appointment, please call your pharmacy.

## 2018-05-05 NOTE — Progress Notes (Signed)
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  Since last being seen in our clinic, the patient reports doing very well.  He did have SOB and poor appetite a week ago.  This has resolved.  He thinks it was due to nyquil which he was taking.  Today, he denies symptoms of palpitations, chest pain, shortness of breath,   dizziness, presyncope, or syncope.  Edema is chronic and stable.  He describes this as hereditary.  The patient is otherwise without complaint today.   Past Medical History:  Diagnosis Date  . Atrial flutter [facility])    s/p CTI ablation October 2010 by Dr Caryl Comes  . Atrioventricular block, type II    s/p PPM  . BPH (benign prostatic hyperplasia)   . CAD (coronary artery disease)    s/p CABG  . Ejection fraction    45%..echo.Marland KitchenMarland KitchenFebruary,2010/ EF 60%....TEE.Marland KitchenMarland KitchenOctober,2010  . Hx of CABG   . Hyperlipemia   . Hypertension   . Mitral regurgitation    mild...echo.Marland KitchenMarland KitchenFebruary 2010  . Pacemaker   . Paroxysmal atrial fibrillation (HCC)   . PFO (patent foramen ovale)    small....TEE....October,2010  . Prostate cancer ([place])   . Pulmonary emboli ([place])    on xarelto   Past Surgical History:  Procedure Laterality Date  . ATRIAL ABLATION SURGERY  2010   CTI ablation for atrial flutter by Dr Caryl Comes 2010  . BACK SURGERY    . CARDIAC PACEMAKER PLACEMENT  06/21/2008   St. Jude dual-chamber permanent pacemaker  . CORONARY ARTERY BYPASS GRAFT  06/17/2008   5 vessel  . INGUINAL HERNIA REPAIR     RIGHT  . LEFT LOWER EXTREMITY REPAIR     from trauma in the distant past secondary to a fall  . PPM GENERATOR CHANGEOUT N/A 04/07/2017    St Jude Medical Assurity MRI model 2272 (serial number [id number]) pacemaker placed by Dr Rayann Heman, indication complete heart block  . TOTAL HIP ARTHROPLASTY    . TOTAL KNEE ARTHROPLASTY      ROS- all systems are reviewed and negative except as per HPI  above  Current Outpatient Medications  Medication Sig Dispense Refill  . aspirin 325 MG tablet Take 325 mg by mouth daily.    . nitroGLYCERIN (NITROSTAT) 0.4 MG SL tablet Place 1 tablet (0.4 mg total) under the tongue every 5 (five) minutes as needed for chest pain. 25 tablet 3  . propranolol (INDERAL) 40 MG tablet Take 60 mg by mouth daily.      No current facility-administered medications for this visit.     Physical Exam: Vitals:   05/05/18 0851  BP: (!) 151/86  Pulse: 61  SpO2: 99%  Weight: 179 lb (81.2 kg)  Height: 5\' 10"  (1.778 m)    GEN- The patient is elderly appearing, alert and oriented x 3 today.   Head- normocephalic, atraumatic Eyes-  Sclera clear, conjunctiva pink Ears- hearing intact Oropharynx- clear Lungs- Clear to ausculation bilaterally, normal work of breathing Chest- pacemaker pocket is well healed Heart- Regular rate and rhythm, no murmurs, rubs or gallops, PMI not laterally displaced GI- soft, NT, ND, + BS Extremities- no clubbing, cyanosis, +2 edema  Pacemaker interrogation- reviewed in detail today,  See PACEART report  ekg tracing ordered today is personally reviewed and shows AV paced  Assessment and Plan:  1. Symptomatic complete heart block Normal pacemaker function See Claudia Desanctis  Art report No changes today  2. CAD s/p CABG Stable No change required today  3. Paroxysmal atrial fibrillation On xarelto Afib burden is 0 % (0% last visit)  4. HTN Stable No change required today He is convinced that ASA helps his BP and is unwilling to not take it  5. Recent SOB and illness Unclear etiology I have advised sodium restriction.  Increased dietary sodium around the holidays may have contributed. Of note, he requests that I prescribe "penicillin" which he take "whenever I feel like I need to".  I have declined to do so today and he is quite unhappy about this.  I did try to explain the appropriate treatment and use of antibiotics.  He prefers  to try to get them from Dr Woody Seller.  Merlin Return in a year  [name] MD, [facility] 05/05/2018 9:34 AM

## 2018-05-10 LAB — CUP PACEART REMOTE DEVICE CHECK
Battery Voltage: 2.98 V
Brady Statistic AP VP Percent: 99 %
Brady Statistic AS VS Percent: 1 %
Brady Statistic RA Percent Paced: 99 %
Brady Statistic RV Percent Paced: 99 %
Implantable Lead Implant Date: 20100226
Implantable Lead Location: 753859
Implantable Pulse Generator Implant Date: 20181213
Lead Channel Impedance Value: 430 Ohm
Lead Channel Pacing Threshold Amplitude: 0.75 V
Lead Channel Pacing Threshold Pulse Width: 0.4 ms
Lead Channel Pacing Threshold Pulse Width: 0.4 ms
Lead Channel Sensing Intrinsic Amplitude: 12 mV
Lead Channel Setting Pacing Amplitude: 2.5 V
Lead Channel Setting Sensing Sensitivity: 4 mV
MDC IDC LEAD IMPLANT DT: 20100226
MDC IDC LEAD LOCATION: 753860
MDC IDC MSMT BATTERY REMAINING LONGEVITY: 103 mo
MDC IDC MSMT BATTERY REMAINING PERCENTAGE: 95.5 %
MDC IDC MSMT LEADCHNL RA IMPEDANCE VALUE: 360 Ohm
MDC IDC MSMT LEADCHNL RA SENSING INTR AMPL: 2.1 mV
MDC IDC MSMT LEADCHNL RV PACING THRESHOLD AMPLITUDE: 1 V
MDC IDC SESS DTM: 20200109094844
MDC IDC SET LEADCHNL RA PACING AMPLITUDE: 2 V
MDC IDC SET LEADCHNL RV PACING PULSEWIDTH: 0.4 ms
MDC IDC STAT BRADY AP VS PERCENT: 1 %
MDC IDC STAT BRADY AS VP PERCENT: 1 %
Pulse Gen Model: 2272
Pulse Gen Serial Number: 8958146

## 2018-05-22 DIAGNOSIS — I252 Old myocardial infarction: Secondary | ICD-10-CM | POA: Diagnosis not present

## 2018-05-22 DIAGNOSIS — N1330 Unspecified hydronephrosis: Secondary | ICD-10-CM | POA: Diagnosis present

## 2018-05-22 DIAGNOSIS — I4891 Unspecified atrial fibrillation: Secondary | ICD-10-CM | POA: Diagnosis not present

## 2018-05-22 DIAGNOSIS — R338 Other retention of urine: Secondary | ICD-10-CM | POA: Diagnosis present

## 2018-05-22 DIAGNOSIS — H04129 Dry eye syndrome of unspecified lacrimal gland: Secondary | ICD-10-CM | POA: Diagnosis not present

## 2018-05-22 DIAGNOSIS — I509 Heart failure, unspecified: Secondary | ICD-10-CM | POA: Diagnosis not present

## 2018-05-22 DIAGNOSIS — N400 Enlarged prostate without lower urinary tract symptoms: Secondary | ICD-10-CM | POA: Diagnosis not present

## 2018-05-22 DIAGNOSIS — R0602 Shortness of breath: Secondary | ICD-10-CM | POA: Diagnosis not present

## 2018-05-22 DIAGNOSIS — I519 Heart disease, unspecified: Secondary | ICD-10-CM | POA: Diagnosis not present

## 2018-05-22 DIAGNOSIS — F419 Anxiety disorder, unspecified: Secondary | ICD-10-CM | POA: Diagnosis present

## 2018-05-22 DIAGNOSIS — N179 Acute kidney failure, unspecified: Secondary | ICD-10-CM | POA: Diagnosis not present

## 2018-05-22 DIAGNOSIS — E875 Hyperkalemia: Secondary | ICD-10-CM | POA: Diagnosis not present

## 2018-05-22 DIAGNOSIS — R319 Hematuria, unspecified: Secondary | ICD-10-CM | POA: Diagnosis not present

## 2018-05-22 DIAGNOSIS — Z8546 Personal history of malignant neoplasm of prostate: Secondary | ICD-10-CM | POA: Diagnosis not present

## 2018-05-22 DIAGNOSIS — Z950 Presence of cardiac pacemaker: Secondary | ICD-10-CM | POA: Diagnosis not present

## 2018-05-22 DIAGNOSIS — N401 Enlarged prostate with lower urinary tract symptoms: Secondary | ICD-10-CM | POA: Diagnosis not present

## 2018-05-22 DIAGNOSIS — N19 Unspecified kidney failure: Secondary | ICD-10-CM | POA: Diagnosis not present

## 2018-05-22 DIAGNOSIS — K5900 Constipation, unspecified: Secondary | ICD-10-CM | POA: Diagnosis not present

## 2018-05-22 DIAGNOSIS — I5023 Acute on chronic systolic (congestive) heart failure: Secondary | ICD-10-CM | POA: Diagnosis not present

## 2018-05-22 DIAGNOSIS — I110 Hypertensive heart disease with heart failure: Secondary | ICD-10-CM | POA: Diagnosis present

## 2018-05-22 DIAGNOSIS — Z515 Encounter for palliative care: Secondary | ICD-10-CM | POA: Diagnosis not present

## 2018-05-22 DIAGNOSIS — M1990 Unspecified osteoarthritis, unspecified site: Secondary | ICD-10-CM | POA: Diagnosis not present

## 2018-05-22 DIAGNOSIS — Z66 Do not resuscitate: Secondary | ICD-10-CM | POA: Diagnosis present

## 2018-05-22 DIAGNOSIS — I2510 Atherosclerotic heart disease of native coronary artery without angina pectoris: Secondary | ICD-10-CM | POA: Diagnosis present

## 2018-05-22 DIAGNOSIS — R52 Pain, unspecified: Secondary | ICD-10-CM | POA: Diagnosis not present

## 2018-05-22 DIAGNOSIS — R112 Nausea with vomiting, unspecified: Secondary | ICD-10-CM | POA: Diagnosis not present

## 2018-05-22 DIAGNOSIS — E7800 Pure hypercholesterolemia, unspecified: Secondary | ICD-10-CM | POA: Diagnosis present

## 2018-05-22 DIAGNOSIS — R531 Weakness: Secondary | ICD-10-CM | POA: Diagnosis not present

## 2018-05-22 DIAGNOSIS — Z951 Presence of aortocoronary bypass graft: Secondary | ICD-10-CM | POA: Diagnosis not present

## 2018-05-22 DIAGNOSIS — F329 Major depressive disorder, single episode, unspecified: Secondary | ICD-10-CM | POA: Diagnosis present

## 2018-05-22 DIAGNOSIS — R630 Anorexia: Secondary | ICD-10-CM | POA: Diagnosis not present

## 2018-05-22 DIAGNOSIS — Z86711 Personal history of pulmonary embolism: Secondary | ICD-10-CM | POA: Diagnosis not present

## 2018-05-22 DIAGNOSIS — I10 Essential (primary) hypertension: Secondary | ICD-10-CM | POA: Diagnosis not present

## 2018-05-29 DIAGNOSIS — I5021 Acute systolic (congestive) heart failure: Secondary | ICD-10-CM | POA: Diagnosis not present

## 2018-05-29 DIAGNOSIS — C61 Malignant neoplasm of prostate: Secondary | ICD-10-CM | POA: Diagnosis not present

## 2018-05-29 DIAGNOSIS — N179 Acute kidney failure, unspecified: Secondary | ICD-10-CM | POA: Diagnosis not present

## 2018-06-25 DEATH — deceased

## 2018-08-03 ENCOUNTER — Telehealth: Payer: Self-pay

## 2018-08-03 ENCOUNTER — Other Ambulatory Visit: Payer: Self-pay

## 2018-08-03 ENCOUNTER — Encounter: Payer: Self-pay | Admitting: *Deleted

## 2018-08-03 NOTE — Telephone Encounter (Signed)
Left message for patient to remind of missed remote transmission.  

## 2018-08-04 ENCOUNTER — Telehealth: Payer: Self-pay | Admitting: Cardiology

## 2018-08-04 NOTE — Telephone Encounter (Signed)
Patient son called and left a voice message. Stating that he received a call stating that pt remote transmission was not received. He stated that pt ha been passed away for six weeks and would like to know why we are just figuring out why the patient home monitor isn't working.   Spoke w/ pt son. He stated that patient was in the hospital the first of February and then at hospice. He feels like we were not monitoring patient. I informed patient son that we were monitoring pt and informed him of the last communication date of the monitor. Pt son disagreed w/ this saying that it was impossible. He stated that pt Is deceased and to take him out of our records.

## 2018-08-25 ENCOUNTER — Encounter: Payer: Medicare Other | Admitting: Internal Medicine

## 2019-04-02 IMAGING — DX DG CHEST 2V
2 series · 2 of 2 positions shown · non-contrast
Comparison: 01/03/2015 CT, radiograph 07/09/2008

CLINICAL DATA: Abnormal gait with dizziness and weakness

EXAM:
CHEST  2 VIEW

[chest pa]
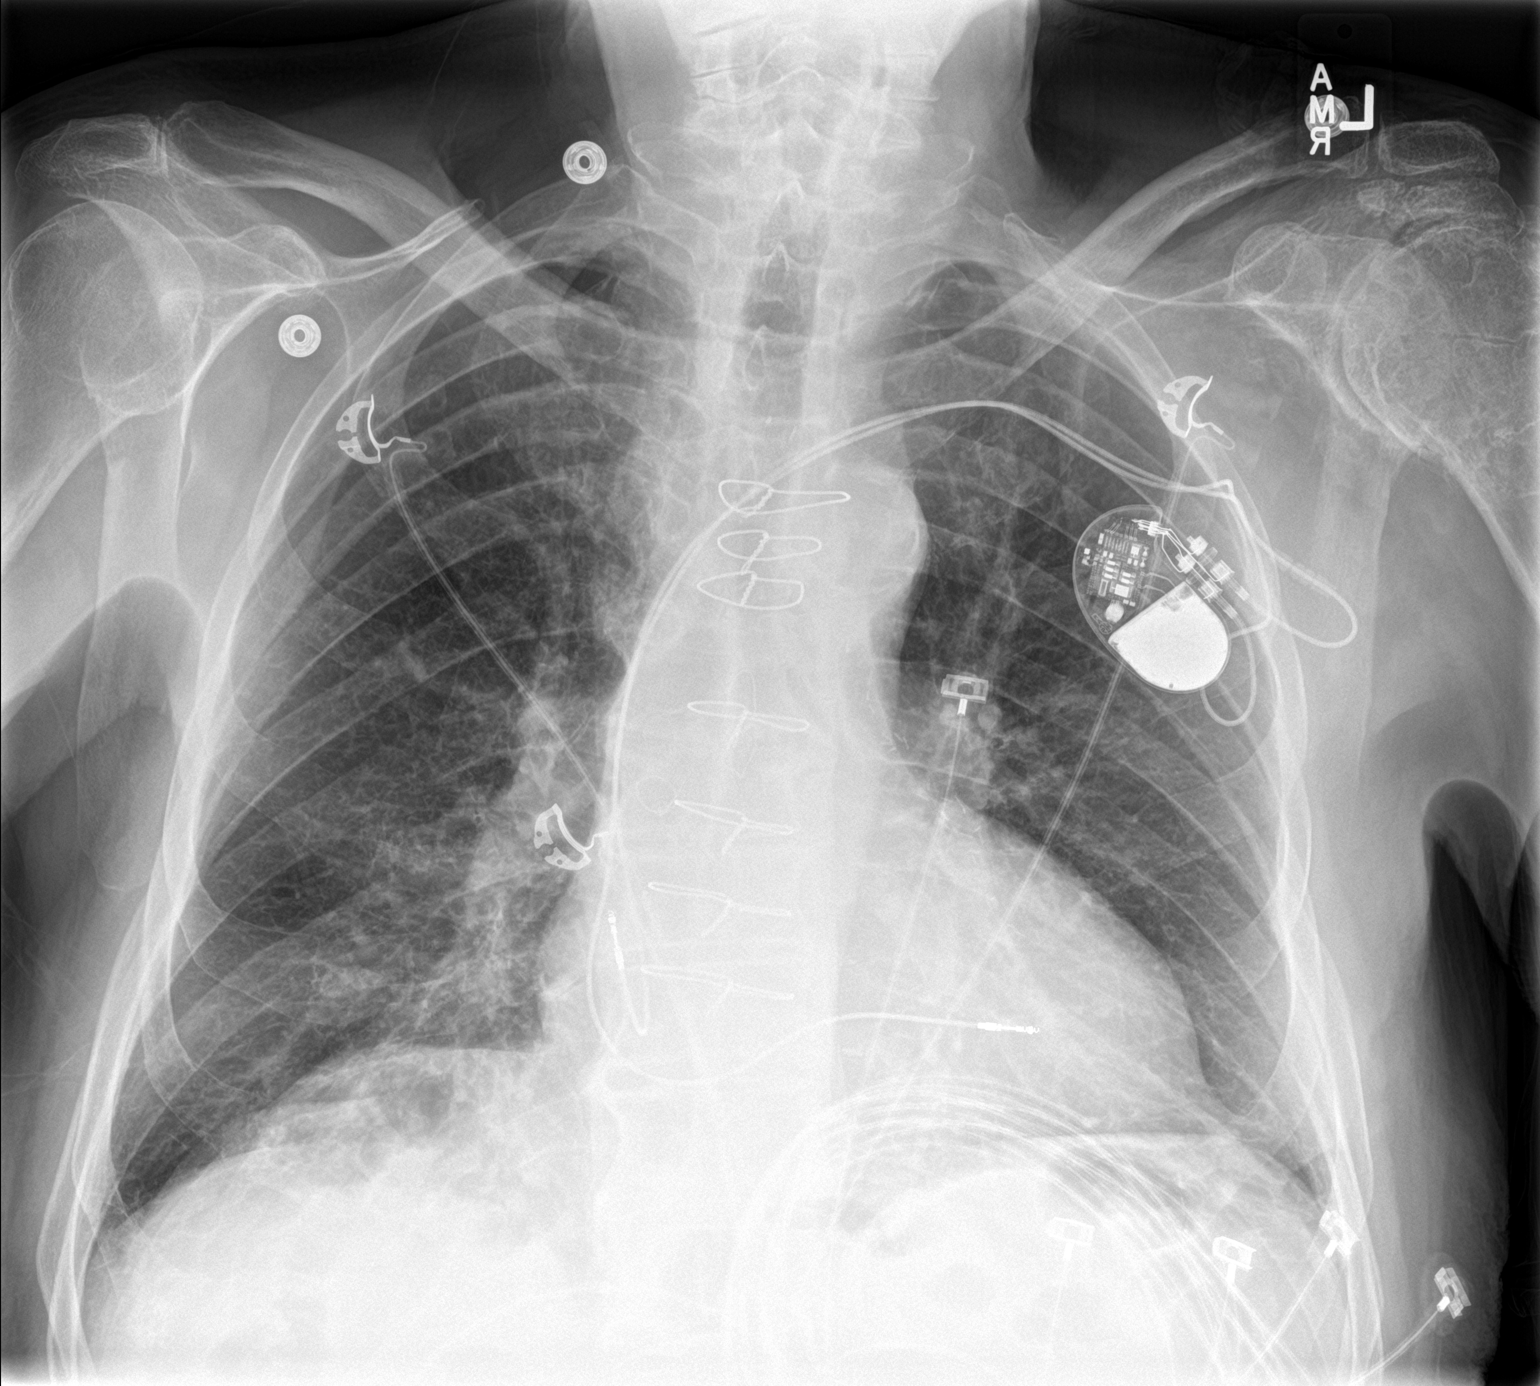

[chest lat]
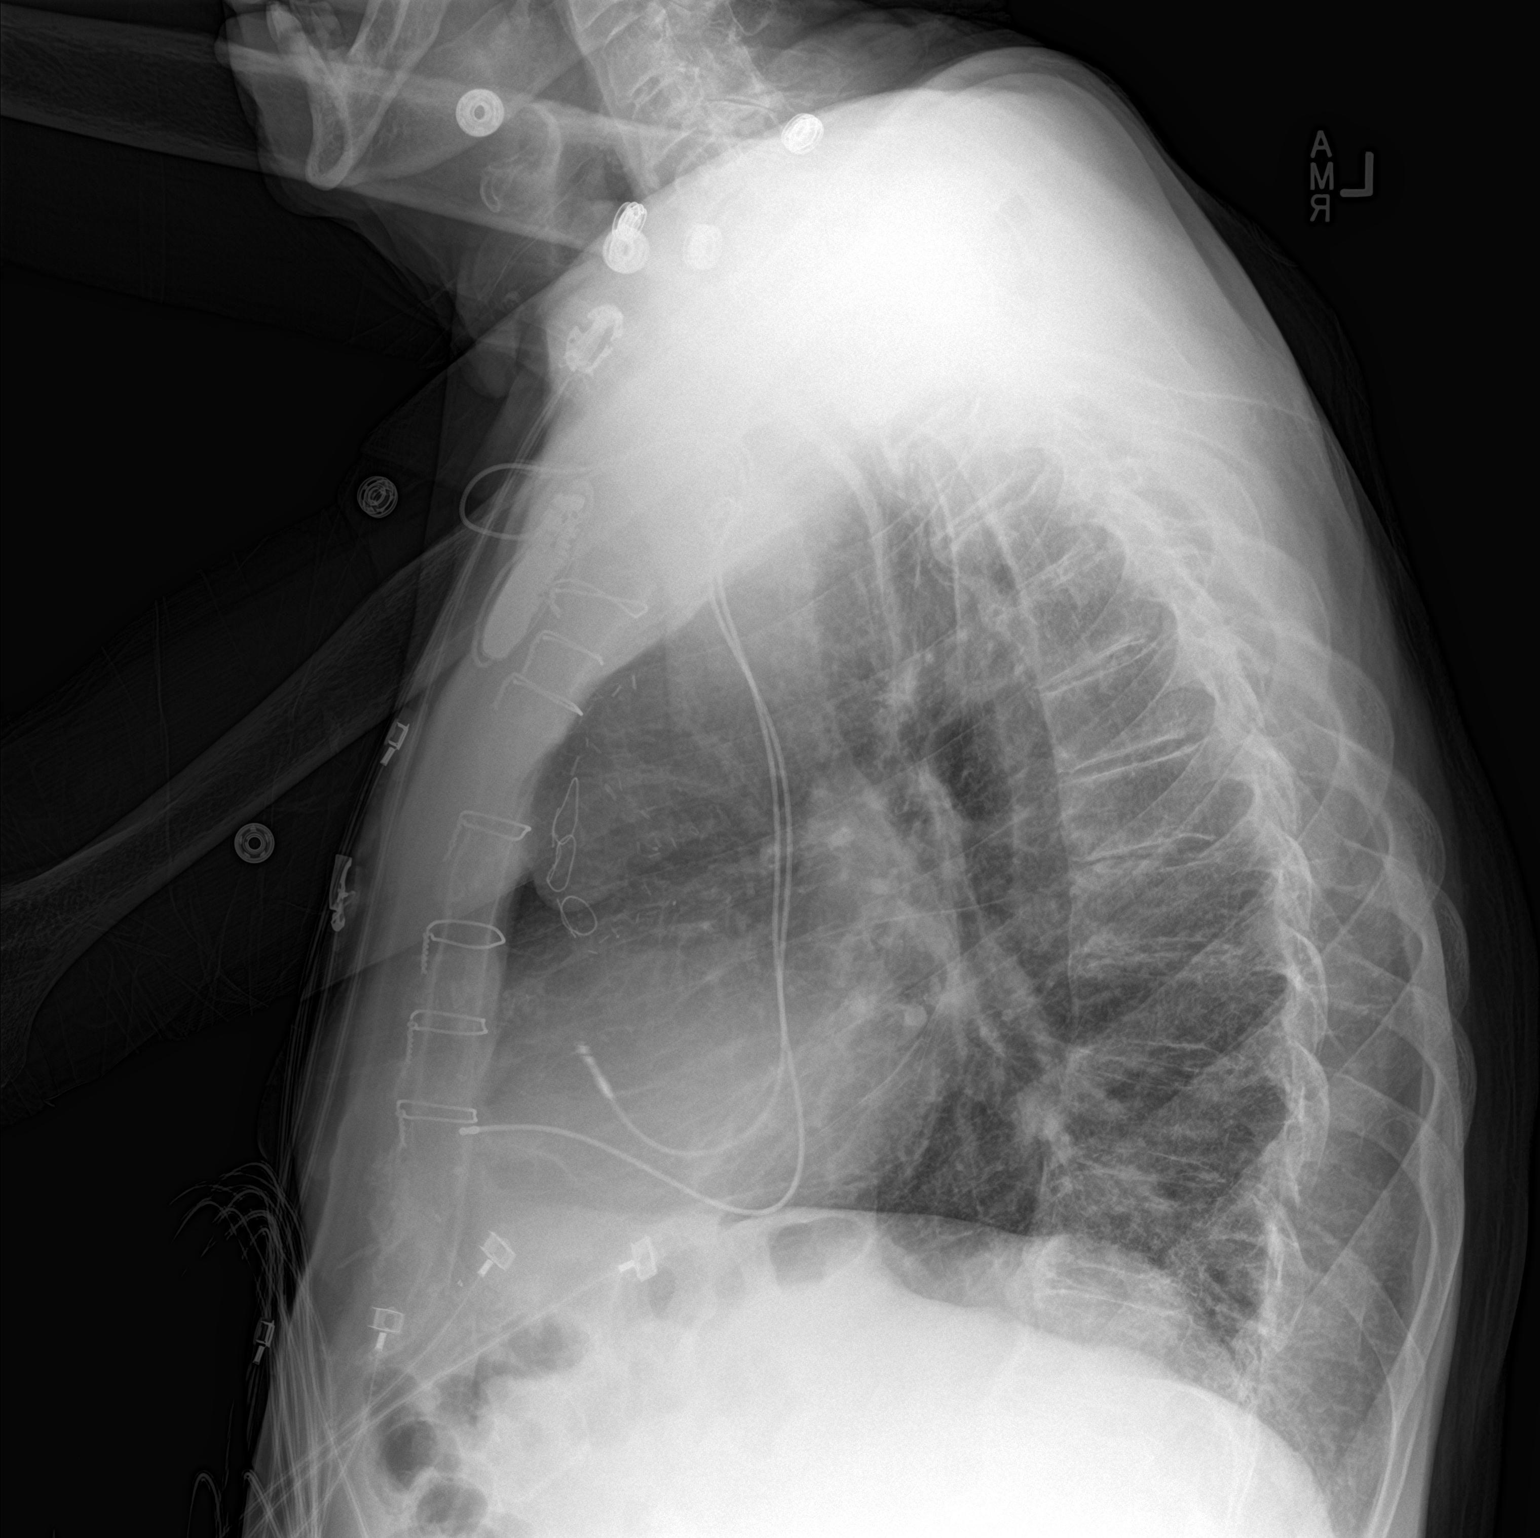

[2 of 2 positions shown; findings below may reference images not displayed]

FINDINGS: Left-sided duo lead pacing device as before. Post sternotomy
changes. Streaky atelectasis or scar at both lung bases. No focal
consolidation or pleural effusion. Mild cardiomegaly with
atherosclerosis. No pneumothorax. Degenerative changes of the spine.
IMPRESSION: Streaky atelectasis or scarring at the lung bases. No acute
infiltrate. Stable mild cardiomegaly without overt failure.

## 2019-05-04 ENCOUNTER — Encounter: Payer: Medicare Other | Admitting: Internal Medicine
